# Patient Record
Sex: Female | Born: 1975
Health system: Southern US, Community
[De-identification: ages and names within clinical notes are randomized; demographics above are authoritative.]

## PROBLEM LIST (undated history)

## (undated) DIAGNOSIS — B977 Papillomavirus as the cause of diseases classified elsewhere: Secondary | ICD-10-CM

## (undated) DIAGNOSIS — D069 Carcinoma in situ of cervix, unspecified: Secondary | ICD-10-CM

## (undated) DIAGNOSIS — N390 Urinary tract infection, site not specified: Secondary | ICD-10-CM

## (undated) DIAGNOSIS — F419 Anxiety disorder, unspecified: Secondary | ICD-10-CM

## (undated) HISTORY — DX: Anxiety disorder, unspecified: F41.9

## (undated) HISTORY — DX: Papillomavirus as the cause of diseases classified elsewhere: B97.7

## (undated) HISTORY — DX: Carcinoma in situ of cervix, unspecified: D06.9

## (undated) HISTORY — DX: Urinary tract infection, site not specified: N39.0

---

## 2005-11-04 ENCOUNTER — Emergency Department: Payer: Self-pay | Admitting: Emergency Medicine

## 2008-07-01 ENCOUNTER — Ambulatory Visit: Payer: Self-pay | Admitting: Internal Medicine

## 2009-05-11 ENCOUNTER — Ambulatory Visit: Payer: Self-pay | Admitting: Internal Medicine

## 2010-06-12 LAB — HM PAP SMEAR: HM Pap smear: NORMAL

## 2010-11-14 ENCOUNTER — Ambulatory Visit: Payer: Self-pay

## 2011-02-28 ENCOUNTER — Ambulatory Visit: Payer: Self-pay | Admitting: Internal Medicine

## 2011-03-02 LAB — BETA STREP CULTURE(ARMC)

## 2011-06-12 ENCOUNTER — Encounter: Payer: Self-pay | Admitting: Internal Medicine

## 2011-06-12 ENCOUNTER — Ambulatory Visit (INDEPENDENT_AMBULATORY_CARE_PROVIDER_SITE_OTHER): Payer: PRIVATE HEALTH INSURANCE | Admitting: Internal Medicine

## 2011-06-12 VITALS — BP 114/72 | HR 72 | Temp 97.5°F | Resp 14 | Ht 66.25 in | Wt 163.0 lb

## 2011-06-12 DIAGNOSIS — IMO0001 Reserved for inherently not codable concepts without codable children: Secondary | ICD-10-CM

## 2011-06-12 DIAGNOSIS — D649 Anemia, unspecified: Secondary | ICD-10-CM

## 2011-06-12 DIAGNOSIS — E039 Hypothyroidism, unspecified: Secondary | ICD-10-CM

## 2011-06-12 DIAGNOSIS — Z309 Encounter for contraceptive management, unspecified: Secondary | ICD-10-CM

## 2011-06-12 DIAGNOSIS — J309 Allergic rhinitis, unspecified: Secondary | ICD-10-CM

## 2011-06-12 DIAGNOSIS — J302 Other seasonal allergic rhinitis: Secondary | ICD-10-CM

## 2011-06-12 LAB — COMPREHENSIVE METABOLIC PANEL
ALT: 13 U/L (ref 0–35)
AST: 18 U/L (ref 0–37)
Alkaline Phosphatase: 46 U/L (ref 39–117)
BUN: 9 mg/dL (ref 6–23)
Creatinine, Ser: 0.7 mg/dL (ref 0.4–1.2)
Total Bilirubin: 0.7 mg/dL (ref 0.3–1.2)

## 2011-06-12 LAB — CBC WITH DIFFERENTIAL/PLATELET
Basophils Absolute: 0 10*3/uL (ref 0.0–0.1)
Hemoglobin: 13.5 g/dL (ref 12.0–15.0)
Lymphocytes Relative: 27.9 % (ref 12.0–46.0)
Monocytes Relative: 4.2 % (ref 3.0–12.0)
Neutro Abs: 4.9 10*3/uL (ref 1.4–7.7)
RBC: 4.19 Mil/uL (ref 3.87–5.11)
RDW: 13 % (ref 11.5–14.6)

## 2011-06-12 LAB — T4, FREE: Free T4: 0.91 ng/dL (ref 0.60–1.60)

## 2011-06-12 MED ORDER — NORETHIN ACE-ETH ESTRAD-FE 1-20 MG-MCG(24) PO TABS: 1.0000 | ORAL_TABLET | Freq: Every day | ORAL | Status: DC

## 2011-06-12 NOTE — Assessment & Plan Note (Signed)
TSH in October 2012 was 5.9. Will repeat TSH and free T4 today. We discussed potential need for medication. Patient will followup in 5 weeks.

## 2011-06-12 NOTE — Assessment & Plan Note (Signed)
Patient reports iron deficiency anemia in the past. Likely secondary to menorrhagia. Will check CBC with labs today.

## 2011-06-12 NOTE — Assessment & Plan Note (Signed)
Minimal improvement with Allegra and Flonase. Will try changing nasal steroid to [facility]. Patient will followup in 5 weeks.

## 2011-06-12 NOTE — Assessment & Plan Note (Signed)
Discussed potential benefits and risks of oral contraceptives were discussed. We'll plan to continue with oral contraceptives now as we try to regulate thyroid function and then consider stopping medication in the future.

## 2011-06-12 NOTE — Progress Notes (Signed)
Subjective:    Patient ID: Colleen Rodriguez, female    DOB: 24-May-1975, 36 y.o.   MRN: [id number]  HPI 36 year old female presents to establish care. She brings with her some lab work from October 2012 which were performed at her work. These show elevated TSH of 5.9. She acknowledges symptoms of fatigue, cold intolerance. She has never been diagnosed with hypothyroidism before.  She also notes history of abnormal Pap smear and CIN-3. She reports that she is due for her yearly Pap smear. Her last Pap smear was in May of 2013 and she reports this was normal. She is currently asymptomatic.  She reports a history of seasonal allergies for which she is taking Flonase and Allegra. She has poor control of her symptoms on these medications. Symptoms include running nose and nasal congestion with sneezing. She has not had fever or chills.  She also reports that she has been taking oral contraceptive pills for approximately 5 years. She questions whether she should continue with these. She reports a history in the past of menorrhagia with iron deficiency anemia. Currently, menstrual cycles are light. She has no family history of blood clots. She is not a smoker. She has no history of hypertension.  Outpatient Encounter Prescriptions as of 06/12/2011  Medication Sig Dispense Refill  . albuterol (PROVENTIL HFA;VENTOLIN HFA) 108 (90 BASE) MCG/ACT inhaler Inhale 2 puffs into the lungs every 6 (six) hours as needed.      . fexofenadine (ALLEGRA) 180 MG tablet Take 180 mg by mouth daily.      . fluticasone (FLONASE) 50 MCG/ACT nasal spray Place 2 sprays into the nose daily.      Marland Kitchen Lysine 500 MG CAPS Take by mouth daily.      . Multiple Vitamin (MULTIVITAMIN) tablet Take 1 tablet by mouth daily.      . Norethindrone Acetate-Ethinyl Estrad-FE (LOESTRIN 24 FE) 1-20 MG-MCG(24) tablet Take 1 tablet by mouth daily.  3 Package  4  . DISCONTD: Norethin Ace-Eth Estrad-FE (LOESTRIN 24 FE PO) Take by mouth daily.         Review of Systems  Constitutional: Positive for fatigue. Negative for fever, chills, appetite change and unexpected weight change.  HENT: Positive for rhinorrhea, sneezing and postnasal drip. Negative for ear pain, congestion, sore throat, trouble swallowing, neck pain, voice change and sinus pressure.   Eyes: Negative for visual disturbance.  Respiratory: Negative for cough, shortness of breath, wheezing and stridor.   Cardiovascular: Negative for chest pain, palpitations and leg swelling.  Gastrointestinal: Negative for nausea, vomiting, abdominal pain, diarrhea, constipation, blood in stool, abdominal distention and anal bleeding.  Genitourinary: Negative for dysuria and flank pain.  Musculoskeletal: Negative for myalgias, arthralgias and gait problem.  Skin: Negative for color change and rash.  Neurological: Negative for dizziness and headaches.  Hematological: Negative for adenopathy. Does not bruise/bleed easily.  Psychiatric/Behavioral: Negative for suicidal ideas, sleep disturbance and dysphoric mood. The patient is not nervous/anxious.        Objective:   Physical Exam  Constitutional: She is oriented to person, place, and time. She appears well-developed and well-nourished. No distress.  HENT:  Head: Normocephalic and atraumatic.  Right Ear: External ear normal.  Left Ear: External ear normal.  Nose: Mucosal edema and rhinorrhea present.  Mouth/Throat: Oropharynx is clear and moist. No oropharyngeal exudate or posterior oropharyngeal erythema.  Eyes: Conjunctivae are normal. Pupils are equal, round, and reactive to light. Right eye exhibits no discharge. Left eye exhibits no discharge. No scleral icterus.  Neck: Normal range of motion. Neck supple. No tracheal deviation present. No thyromegaly present.  Cardiovascular: Normal rate, regular rhythm, normal heart sounds and intact distal pulses.  Exam reveals no gallop and no friction rub.   No murmur heard. Pulmonary/Chest:  Effort normal and breath sounds normal. No respiratory distress. She has no wheezes. She has no rales. She exhibits no tenderness.  Abdominal: Soft. Bowel sounds are normal. She exhibits no distension and no mass. There is no tenderness. There is no guarding.  Musculoskeletal: Normal range of motion. She exhibits no edema and no tenderness.  Lymphadenopathy:    She has no cervical adenopathy.  Neurological: She is alert and oriented to person, place, and time. No cranial nerve deficit. She exhibits normal muscle tone. Coordination normal.  Skin: Skin is warm and dry. No rash noted. She is not diaphoretic. No erythema. No pallor.  Psychiatric: She has a normal mood and affect. Her behavior is normal. Judgment and thought content normal.          Assessment & Plan:

## 2011-06-13 ENCOUNTER — Telehealth: Payer: Self-pay | Admitting: *Deleted

## 2011-06-13 NOTE — Telephone Encounter (Signed)
Message copied by Regis Bill on Tue Jun 13, 2011  2:28 PM ------      Message from: Ronna Polio A      Created: Mon Jun 12, 2011  6:37 PM       Labs show normal thyroid function. This was unexpected given that her screening labs at the hospital were abnormal.  It can happen sometimes, that the TSH is slightly elevated, then returns to normal.  We should probably recheck in 3 to 6 months, but I would not recommend starting any medication now.  We can add a thyroid antibody to see if any evidence of ongoing thyroid issue which might lead to problems down the road.

## 2011-06-13 NOTE — Telephone Encounter (Signed)
[facility] w/contact name & number for patient to call back for JAW advice on lab results/SLS

## 2011-06-19 NOTE — Telephone Encounter (Signed)
Left 2nd message for patient regarding thyroid test results: informed that it was concerning thyroid lab that Dr. Dan Humphreys would like to repeat in 3-6 months, and I have additional information for her at this time/SLS

## 2011-06-21 NOTE — Telephone Encounter (Signed)
Patient informed, she has OV in June and will discuss further with you/SLS

## 2011-07-20 ENCOUNTER — Encounter: Payer: Self-pay | Admitting: Internal Medicine

## 2011-07-20 ENCOUNTER — Ambulatory Visit (INDEPENDENT_AMBULATORY_CARE_PROVIDER_SITE_OTHER): Payer: PRIVATE HEALTH INSURANCE | Admitting: Internal Medicine

## 2011-07-20 ENCOUNTER — Other Ambulatory Visit (HOSPITAL_COMMUNITY)
Admission: RE | Admit: 2011-07-20 | Discharge: 2011-07-20 | Disposition: A | Discharge status: Home / Self Care | Payer: PRIVATE HEALTH INSURANCE | Source: Ambulatory Visit | Attending: Internal Medicine | Admitting: Internal Medicine

## 2011-07-20 VITALS — BP 102/60 | HR 85 | Temp 98.5°F | Ht 66.25 in | Wt 159.5 lb

## 2011-07-20 DIAGNOSIS — E079 Disorder of thyroid, unspecified: Secondary | ICD-10-CM

## 2011-07-20 DIAGNOSIS — Z01419 Encounter for gynecological examination (general) (routine) without abnormal findings: Secondary | ICD-10-CM | POA: Insufficient documentation

## 2011-07-20 DIAGNOSIS — Z0000 Encounter for general adult medical examination without abnormal findings: Secondary | ICD-10-CM | POA: Insufficient documentation

## 2011-07-20 DIAGNOSIS — Z124 Encounter for screening for malignant neoplasm of cervix: Secondary | ICD-10-CM

## 2011-07-20 DIAGNOSIS — Z1159 Encounter for screening for other viral diseases: Secondary | ICD-10-CM | POA: Insufficient documentation

## 2011-07-20 LAB — HM PAP SMEAR: HM Pap smear: NEGATIVE

## 2011-07-20 NOTE — Assessment & Plan Note (Signed)
General medical exam including breast exam and pelvic exam are normal today Pap is pending. Patient is up-to-date on health maintenance. Encourage continued efforts at healthy diet and regular physical activity. Patient will followup in 3 months.

## 2011-07-20 NOTE — Assessment & Plan Note (Signed)
Repeat thyroid labs were normal. Will plan to recheck TSH, free T4, and thyroid antibodies in 3 months.

## 2011-07-20 NOTE — Progress Notes (Signed)
Subjective:    Patient ID: Colleen Rodriguez, female    DOB: 1975-10-31, 36 y.o.   MRN: [id number]  HPI 36 year old female with history of abnormal thyroid function based on lab work presents for annual exam. She reports that she is generally feeling well. Repeat thyroid function test performed last month were normal. She denies any significant fatigue, or other symptoms. She follows a relatively healthy diet and gets regular physical activity. She is up-to-date on health maintenance with the exception of the Pap smear.  Outpatient Encounter Prescriptions as of 07/20/2011  Medication Sig Dispense Refill  . albuterol (PROVENTIL HFA;VENTOLIN HFA) 108 (90 BASE) MCG/ACT inhaler Inhale 2 puffs into the lungs every 6 (six) hours as needed.      . fexofenadine (ALLEGRA) 180 MG tablet Take 180 mg by mouth daily.      . fluticasone (FLONASE) 50 MCG/ACT nasal spray Place 2 sprays into the nose daily.      Marland Kitchen Lysine 500 MG CAPS Take by mouth daily.      . Multiple Vitamin (MULTIVITAMIN) tablet Take 1 tablet by mouth daily.      . Norethindrone Acetate-Ethinyl Estrad-FE (LOESTRIN 24 FE) 1-20 MG-MCG(24) tablet Take 1 tablet by mouth daily.  3 Package  4    Review of Systems  Constitutional: Negative for fever, chills, appetite change, fatigue and unexpected weight change.  HENT: Negative for ear pain, congestion, sore throat, trouble swallowing, neck pain, voice change and sinus pressure.   Eyes: Negative for visual disturbance.  Respiratory: Negative for cough, shortness of breath, wheezing and stridor.   Cardiovascular: Negative for chest pain, palpitations and leg swelling.  Gastrointestinal: Negative for nausea, vomiting, abdominal pain, diarrhea, constipation, blood in stool, abdominal distention and anal bleeding.  Genitourinary: Negative for dysuria and flank pain.  Musculoskeletal: Negative for myalgias, arthralgias and gait problem.  Skin: Negative for color change and rash.  Neurological: Negative  for dizziness and headaches.  Hematological: Negative for adenopathy. Does not bruise/bleed easily.  Psychiatric/Behavioral: Negative for suicidal ideas, disturbed wake/sleep cycle and dysphoric mood. The patient is not nervous/anxious.    BP 102/60  Pulse 85  Temp 98.5 F (36.9 C) (Oral)  Ht 5' 6.25" (1.683 m)  Wt 159 lb 8 oz (72.349 kg)  BMI 25.55 kg/m2  SpO2 98%  LMP 07/02/2011     Objective:   Physical Exam  Constitutional: She is oriented to person, place, and time. She appears well-developed and well-nourished. No distress.  HENT:  Head: Normocephalic and atraumatic.  Right Ear: External ear normal.  Left Ear: External ear normal.  Nose: Nose normal.  Mouth/Throat: Oropharynx is clear and moist. No oropharyngeal exudate.  Eyes: Conjunctivae are normal. Pupils are equal, round, and reactive to light. Right eye exhibits no discharge. Left eye exhibits no discharge. No scleral icterus.  Neck: Normal range of motion. Neck supple. No tracheal deviation present. No thyromegaly present.  Cardiovascular: Normal rate, regular rhythm, normal heart sounds and intact distal pulses.  Exam reveals no gallop and no friction rub.   No murmur heard. Pulmonary/Chest: Effort normal and breath sounds normal. No respiratory distress. She has no wheezes. She has no rales. She exhibits no tenderness.  Abdominal: Soft. Bowel sounds are normal. She exhibits no distension and no mass. There is no tenderness. There is no rebound and no guarding.  Genitourinary: Rectum normal, vagina normal and uterus normal. No breast swelling, tenderness, discharge or bleeding. Pelvic exam was performed with patient supine. There is no rash, tenderness or lesion  on the right labia. There is no rash, tenderness or lesion on the left labia. Uterus is not enlarged and not tender. Cervix exhibits no motion tenderness, no discharge and no friability. Right adnexum displays no mass, no tenderness and no fullness. Left adnexum  displays no mass, no tenderness and no fullness. No erythema or tenderness around the vagina. No vaginal discharge found.  Musculoskeletal: Normal range of motion. She exhibits no edema and no tenderness.  Lymphadenopathy:    She has no cervical adenopathy.  Neurological: She is alert and oriented to person, place, and time. No cranial nerve deficit. She exhibits normal muscle tone. Coordination normal.  Skin: Skin is warm and dry. No rash noted. She is not diaphoretic. No erythema. No pallor.  Psychiatric: She has a normal mood and affect. Her behavior is normal. Judgment and thought content normal.          Assessment & Plan:

## 2011-10-16 ENCOUNTER — Other Ambulatory Visit: Payer: PRIVATE HEALTH INSURANCE

## 2011-10-23 ENCOUNTER — Ambulatory Visit: Payer: PRIVATE HEALTH INSURANCE | Admitting: Internal Medicine

## 2011-10-23 DIAGNOSIS — Z0289 Encounter for other administrative examinations: Secondary | ICD-10-CM

## 2012-03-16 ENCOUNTER — Other Ambulatory Visit: Payer: Self-pay

## 2012-07-23 ENCOUNTER — Other Ambulatory Visit: Payer: Self-pay | Admitting: *Deleted

## 2012-07-23 DIAGNOSIS — IMO0001 Reserved for inherently not codable concepts without codable children: Secondary | ICD-10-CM

## 2012-07-23 NOTE — Telephone Encounter (Signed)
Pt left voicemail requesting Rx refill, no recent or future appt and pt no-showed last appt scheduled, please advised

## 2012-07-23 NOTE — Telephone Encounter (Signed)
Needs follow up visit (gen med exam) as over 1 year since last seen.

## 2012-10-22 ENCOUNTER — Other Ambulatory Visit: Payer: Self-pay | Admitting: *Deleted

## 2012-10-22 DIAGNOSIS — IMO0001 Reserved for inherently not codable concepts without codable children: Secondary | ICD-10-CM

## 2012-10-22 NOTE — Telephone Encounter (Signed)
Pt needs office visit

## 2012-10-22 NOTE — Telephone Encounter (Signed)
Refill Request  Minastrin 24 FE chewable   #84  Take one tablet by mouth daily

## 2012-11-06 ENCOUNTER — Other Ambulatory Visit: Payer: Self-pay | Admitting: *Deleted

## 2012-11-06 ENCOUNTER — Telehealth: Payer: Self-pay | Admitting: Internal Medicine

## 2012-11-06 DIAGNOSIS — IMO0001 Reserved for inherently not codable concepts without codable children: Secondary | ICD-10-CM

## 2012-11-06 NOTE — Telephone Encounter (Signed)
Refill Request  Minastrin 24 FE chewable  #84  Take one tablet by mouth daily

## 2012-11-06 NOTE — Telephone Encounter (Signed)
Pt calling for status of Flonase and Loestrin refills.  States her pharmacy faxed requests last week.  Pt states she called on Monday regarding this.  Pt states [facility] still does not have response and is resending the request today.  Pt asking for a call when this is complete.

## 2012-11-07 MED ORDER — NORETHIN ACE-ETH ESTRAD-FE 1-20 MG-MCG(24) PO TABS: 1.0000 | ORAL_TABLET | Freq: Every day | ORAL | Status: DC

## 2012-11-07 MED ORDER — FLUTICASONE PROPIONATE 50 MCG/ACT NA SUSP: 2.0000 | Freq: Every day | NASAL | Status: DC

## 2012-11-07 NOTE — Telephone Encounter (Signed)
Spoke with patient explained to her that her refills was refused because it has been more than a year since she was seen. Patient understood and appointment was made for 11/7, also aware that I would call her in a 30 day supply to the pharmacy.

## 2012-11-26 ENCOUNTER — Encounter: Payer: Self-pay | Admitting: Orthopedic Surgery

## 2012-11-30 ENCOUNTER — Encounter: Payer: Self-pay | Admitting: Orthopedic Surgery

## 2012-12-05 ENCOUNTER — Other Ambulatory Visit: Payer: Self-pay

## 2012-12-06 ENCOUNTER — Ambulatory Visit (INDEPENDENT_AMBULATORY_CARE_PROVIDER_SITE_OTHER): Payer: 59 | Admitting: Internal Medicine

## 2012-12-06 ENCOUNTER — Encounter: Payer: Self-pay | Admitting: Internal Medicine

## 2012-12-06 VITALS — BP 100/62 | HR 72 | Temp 98.2°F | Ht 66.5 in | Wt 150.0 lb

## 2012-12-06 DIAGNOSIS — IMO0001 Reserved for inherently not codable concepts without codable children: Secondary | ICD-10-CM

## 2012-12-06 DIAGNOSIS — Z0000 Encounter for general adult medical examination without abnormal findings: Secondary | ICD-10-CM

## 2012-12-06 DIAGNOSIS — Z309 Encounter for contraceptive management, unspecified: Secondary | ICD-10-CM

## 2012-12-06 DIAGNOSIS — M25519 Pain in unspecified shoulder: Secondary | ICD-10-CM

## 2012-12-06 DIAGNOSIS — M25512 Pain in left shoulder: Secondary | ICD-10-CM

## 2012-12-06 LAB — COMPREHENSIVE METABOLIC PANEL
ALT: 14 U/L (ref 0–35)
AST: 15 U/L (ref 0–37)
Albumin: 4 g/dL (ref 3.5–5.2)
Alkaline Phosphatase: 46 U/L (ref 39–117)
Glucose, Bld: 85 mg/dL (ref 70–99)
Potassium: 4.6 mEq/L (ref 3.5–5.1)
Sodium: 138 mEq/L (ref 135–145)
Total Bilirubin: 0.6 mg/dL (ref 0.3–1.2)
Total Protein: 7.3 g/dL (ref 6.0–8.3)

## 2012-12-06 LAB — LIPID PANEL
LDL Cholesterol: 101 mg/dL — ABNORMAL HIGH (ref 0–99)
Total CHOL/HDL Ratio: 2

## 2012-12-06 LAB — CBC
Platelets: 246 10*3/uL (ref 150.0–400.0)
RBC: 4.08 Mil/uL (ref 3.87–5.11)
WBC: 8.1 10*3/uL (ref 4.5–10.5)

## 2012-12-06 MED ORDER — HYDROCODONE-ACETAMINOPHEN 5-325 MG PO TABS: 1.0000 | ORAL_TABLET | Freq: Three times a day (TID) | ORAL | Status: DC | PRN

## 2012-12-06 MED ORDER — NORETHIN ACE-ETH ESTRAD-FE 1-20 MG-MCG(24) PO TABS: 1.0000 | ORAL_TABLET | Freq: Every day | ORAL | Status: DC

## 2012-12-06 NOTE — Progress Notes (Signed)
Subjective:    Patient ID: Colleen Rodriguez, female    DOB: [birth year], 37 y.o.   MRN: [id number]  HPI 37YO female presents for annual exam. Generally feeling well. Only recent concern has been with left shoulder pain. Described as aching pain, severe with movement of her left arm over last several months. No known injury to her shoulder. Being seen by Dr. Kingsley Plan in ortho. PT in progress. No improvement yet. Taking Ibuprofen 800mg  tid with minimal improvement. Notes occasional abdominal pain, described as burning after taking ibuprofen. Trying to take with food. Follows a healthy diet. Exercises regularly. Flu vaccine UTD.  Outpatient Encounter Prescriptions as of 12/06/2012  Medication Sig  . albuterol (PROVENTIL HFA;VENTOLIN HFA) 108 (90 BASE) MCG/ACT inhaler Inhale 2 puffs into the lungs every 6 (six) hours as needed.  . fexofenadine (ALLEGRA) 180 MG tablet Take 180 mg by mouth daily.  . fluticasone (FLONASE) 50 MCG/ACT nasal spray Place 2 sprays into the nose daily.  Marland Kitchen ibuprofen (ADVIL,MOTRIN) 800 MG tablet Take 800 mg by mouth every 8 (eight) hours as needed for mild pain (As needed for shoulder pain).  . Lysine 500 MG CAPS Take by mouth daily.  . Multiple Vitamin (MULTIVITAMIN) tablet Take 1 tablet by mouth daily.  . Norethindrone Acetate-Ethinyl Estrad-FE (LOESTRIN 24 FE) 1-20 MG-MCG(24) tablet Take 1 tablet by mouth daily.   BP 100/62  Pulse 72  Temp(Src) 98.2 F (36.8 C) (Oral)  Ht 5' 6.5" (1.689 m)  Wt 150 lb (68.04 kg)  BMI 23.85 kg/m2  SpO2 97%  LMP 11/30/2012  Review of Systems  Constitutional: Negative for fever, chills, appetite change, fatigue and unexpected weight change.  HENT: Negative for congestion, ear pain, sinus pressure, sore throat, trouble swallowing and voice change.   Eyes: Negative for visual disturbance.  Respiratory: Negative for cough, shortness of breath, wheezing and stridor.   Cardiovascular: Negative for chest pain, palpitations and leg swelling.   Gastrointestinal: Negative for nausea, vomiting, abdominal pain, diarrhea, constipation, blood in stool, abdominal distention and anal bleeding.  Genitourinary: Negative for dysuria and flank pain.  Musculoskeletal: Positive for arthralgias (left shoulder). Negative for gait problem, myalgias and neck pain.  Skin: Negative for color change and rash.  Neurological: Negative for dizziness and headaches.  Hematological: Negative for adenopathy. Does not bruise/bleed easily.  Psychiatric/Behavioral: Negative for suicidal ideas, sleep disturbance and dysphoric mood. The patient is not nervous/anxious.        Objective:   Physical Exam  Constitutional: She is oriented to person, place, and time. She appears well-developed and well-nourished. No distress.  HENT:  Head: Normocephalic and atraumatic.  Right Ear: External ear normal.  Left Ear: External ear normal.  Nose: Nose normal.  Mouth/Throat: Oropharynx is clear and moist. No oropharyngeal exudate.  Eyes: Conjunctivae are normal. Pupils are equal, round, and reactive to light. Right eye exhibits no discharge. Left eye exhibits no discharge. No scleral icterus.  Neck: Normal range of motion. Neck supple. No tracheal deviation present. No thyromegaly present.  Cardiovascular: Normal rate, regular rhythm, normal heart sounds and intact distal pulses.  Exam reveals no gallop and no friction rub.   No murmur heard. Pulmonary/Chest: Effort normal and breath sounds normal. No accessory muscle usage. Not tachypneic. No respiratory distress. She has no decreased breath sounds. She has no wheezes. She has no rales. She exhibits no tenderness. Right breast exhibits no inverted nipple, no mass, no nipple discharge, no skin change and no tenderness. Left breast exhibits no inverted nipple, no mass,  no nipple discharge, no skin change and no tenderness. Breasts are symmetrical.  Abdominal: Soft. Bowel sounds are normal. She exhibits no distension and no  mass. There is no tenderness. There is no rebound and no guarding.  Musculoskeletal: She exhibits no edema.       Left shoulder: She exhibits decreased range of motion, tenderness and pain (severe with any movement left arm). She exhibits no swelling.  Lymphadenopathy:    She has no cervical adenopathy.  Neurological: She is alert and oriented to person, place, and time. No cranial nerve deficit. She exhibits normal muscle tone. Coordination normal.  Skin: Skin is warm and dry. No rash noted. She is not diaphoretic. No erythema. No pallor.  Psychiatric: She has a normal mood and affect. Her behavior is normal. Judgment and thought content normal.          Assessment & Plan:

## 2012-12-06 NOTE — Assessment & Plan Note (Signed)
General medical exam including breast exam normal today except as noted. PAP deferred as normal 2012, plan repeat 2015. Encouraged continued healthy diet and regular physical activity. Immunizations are UTD. Encouraged healthy diet and regular exercise.

## 2012-12-06 NOTE — Progress Notes (Signed)
Pre-visit discussion using our clinic review tool. No additional management support is needed unless otherwise documented below in the visit note.  

## 2012-12-06 NOTE — Assessment & Plan Note (Signed)
Left shoulder pain of unclear etiology. No improvement with PT, ibuprofen. Severe pain with abduction of her left arm today. Will add hydrocodone prn. Encouraged her to follow up with her orthopedic surgeon.

## 2012-12-07 LAB — VITAMIN D 25 HYDROXY (VIT D DEFICIENCY, FRACTURES): Vit D, 25-Hydroxy: 44 ng/mL (ref 30–89)

## 2012-12-30 ENCOUNTER — Encounter: Payer: Self-pay | Admitting: Orthopedic Surgery

## 2012-12-31 ENCOUNTER — Encounter: Payer: Self-pay | Admitting: Internal Medicine

## 2012-12-31 ENCOUNTER — Ambulatory Visit (INDEPENDENT_AMBULATORY_CARE_PROVIDER_SITE_OTHER): Payer: 59 | Admitting: Internal Medicine

## 2012-12-31 VITALS — BP 90/50 | HR 88 | Temp 98.2°F | Wt 156.0 lb

## 2012-12-31 DIAGNOSIS — J069 Acute upper respiratory infection, unspecified: Secondary | ICD-10-CM

## 2012-12-31 DIAGNOSIS — J329 Chronic sinusitis, unspecified: Secondary | ICD-10-CM

## 2012-12-31 MED ORDER — HYDROCOD POLST-CHLORPHEN POLST 10-8 MG/5ML PO LQCR: 5.0000 mL | Freq: Two times a day (BID) | ORAL | Status: DC | PRN

## 2012-12-31 MED ORDER — AZITHROMYCIN 250 MG PO TABS: ORAL_TABLET | ORAL | Status: DC

## 2012-12-31 NOTE — Progress Notes (Signed)
Subjective:    Patient ID: Colleen Rodriguez, female    DOB: 02-17-1975, 37 y.o.   MRN: [id number]  HPI 37 year old female presents for acute visit complaining of three-day history of clear nasal drainage and nonproductive cough. Symptoms began suddenly over the weekend. Her son was sick with similar symptoms about 2 weeks ago. She had subjective fever and chills over the weekend with occasional muscle aches. She then developed clear nasal congestion with postnasal drip and cough. She denies shortness of breath or chest pain. She has been taking Advil cold and sinus with no improvement.  Outpatient Prescriptions Prior to Visit  Medication Sig Dispense Refill  . albuterol (PROVENTIL HFA;VENTOLIN HFA) 108 (90 BASE) MCG/ACT inhaler Inhale 2 puffs into the lungs every 6 (six) hours as needed.      . fexofenadine (ALLEGRA) 180 MG tablet Take 180 mg by mouth daily.      . fluticasone (FLONASE) 50 MCG/ACT nasal spray Place 2 sprays into the nose daily.  16 g  0  . ibuprofen (ADVIL,MOTRIN) 800 MG tablet Take 800 mg by mouth every 8 (eight) hours as needed for mild pain (As needed for shoulder pain).      . Lysine 500 MG CAPS Take by mouth daily.      . Multiple Vitamin (MULTIVITAMIN) tablet Take 1 tablet by mouth daily.      . Norethindrone Acetate-Ethinyl Estrad-FE (LOESTRIN 24 FE) 1-20 MG-MCG(24) tablet Take 1 tablet by mouth daily.  3 Package  4  . HYDROcodone-acetaminophen (NORCO/VICODIN) 5-325 MG per tablet Take 1 tablet by mouth 3 (three) times daily as needed for moderate pain.  90 tablet  0   No facility-administered medications prior to visit.   BP 90/50  Pulse 88  Temp(Src) 98.2 F (36.8 C) (Oral)  Wt 156 lb (70.761 kg)  SpO2 97%  LMP 11/30/2012  Review of Systems  Constitutional: Positive for fever (subjective). Negative for chills and unexpected weight change.  HENT: Positive for congestion, postnasal drip and rhinorrhea. Negative for ear discharge, ear pain, facial swelling, hearing  loss, mouth sores, nosebleeds, sinus pressure, sneezing, sore throat, tinnitus, trouble swallowing and voice change.   Eyes: Negative for pain, discharge, redness and visual disturbance.  Respiratory: Positive for cough. Negative for chest tightness, shortness of breath, wheezing and stridor.   Cardiovascular: Negative for chest pain, palpitations and leg swelling.  Musculoskeletal: Negative for arthralgias, myalgias, neck pain and neck stiffness.  Skin: Negative for color change and rash.  Neurological: Negative for dizziness, weakness, light-headedness and headaches.  Hematological: Negative for adenopathy.       Objective:   Physical Exam  Constitutional: She is oriented to person, place, and time. She appears well-developed and well-nourished. No distress.  HENT:  Head: Normocephalic and atraumatic.  Right Ear: External ear normal.  Left Ear: External ear normal.  Nose: Rhinorrhea present.  Mouth/Throat: Oropharynx is clear and moist. No oropharyngeal exudate.  Eyes: Conjunctivae are normal. Pupils are equal, round, and reactive to light. Right eye exhibits no discharge. Left eye exhibits no discharge. No scleral icterus.  Neck: Normal range of motion. Neck supple. No tracheal deviation present. No thyromegaly present.  Cardiovascular: Normal rate, regular rhythm, normal heart sounds and intact distal pulses.  Exam reveals no gallop and no friction rub.   No murmur heard. Pulmonary/Chest: Effort normal and breath sounds normal. No accessory muscle usage. Not tachypneic. No respiratory distress. She has no decreased breath sounds. She has no wheezes. She has no rhonchi. She has  no rales. She exhibits no tenderness.  Musculoskeletal: Normal range of motion. She exhibits no edema and no tenderness.  Lymphadenopathy:    She has no cervical adenopathy.  Neurological: She is alert and oriented to person, place, and time. No cranial nerve deficit. She exhibits normal muscle tone.  Coordination normal.  Skin: Skin is warm and dry. No rash noted. She is not diaphoretic. No erythema. No pallor.  Psychiatric: She has a normal mood and affect. Her behavior is normal. Judgment and thought content normal.          Assessment & Plan:

## 2012-12-31 NOTE — Assessment & Plan Note (Signed)
Symptoms are most consistent with viral URI with cough. Encouraged rest, adequate fluid intake, use of sudafed for congestion. Prn tylenol or advil for pain or fever. If symptoms are persistent with increasing sinus pressure, recurrent fever, productive cough, then pt will start antibiotics with Azithromycin. Rx to take given today. She will email with update on Thursday or Friday this week.

## 2012-12-31 NOTE — Progress Notes (Signed)
Pre-visit discussion using our clinic review tool. No additional management support is needed unless otherwise documented below in the visit note.  

## 2013-01-01 ENCOUNTER — Encounter: Payer: Self-pay | Admitting: *Deleted

## 2013-01-30 ENCOUNTER — Encounter: Payer: Self-pay | Admitting: Orthopedic Surgery

## 2013-03-09 ENCOUNTER — Encounter: Payer: Self-pay | Admitting: Internal Medicine

## 2013-03-10 ENCOUNTER — Ambulatory Visit (INDEPENDENT_AMBULATORY_CARE_PROVIDER_SITE_OTHER): Payer: 59 | Admitting: Adult Health

## 2013-03-10 ENCOUNTER — Encounter: Payer: Self-pay | Admitting: Adult Health

## 2013-03-10 VITALS — BP 102/60 | HR 81 | Temp 97.9°F | Resp 12 | Wt 155.0 lb

## 2013-03-10 DIAGNOSIS — R319 Hematuria, unspecified: Secondary | ICD-10-CM

## 2013-03-10 DIAGNOSIS — R300 Dysuria: Secondary | ICD-10-CM

## 2013-03-10 LAB — POCT URINALYSIS DIPSTICK
Bilirubin, UA: NEGATIVE
GLUCOSE UA: NEGATIVE
Ketones, UA: NEGATIVE
LEUKOCYTES UA: NEGATIVE
NITRITE UA: NEGATIVE
Protein, UA: NEGATIVE
Spec Grav, UA: >=1.03
UROBILINOGEN UA: 0.2
pH, UA: 6.5

## 2013-03-10 MED ORDER — CIPROFLOXACIN HCL 250 MG PO TABS: 250.0000 mg | ORAL_TABLET | Freq: Two times a day (BID) | ORAL | Status: DC

## 2013-03-10 NOTE — Progress Notes (Signed)
Patient ID: Colleen BeringKathyrn Hicks, female   DOB: [birth year], 38 y.o.   MRN: [id number]    Subjective:    Patient ID: Colleen Rodriguez, female    DOB: [birth year], 38 y.o.   MRN: [id number]  HPI  Pt is a 38 y/o female who presents with dysuria, hematuria, pelvic pressure. Symptoms started yesterday. She has not taken any OTC products.   Past Medical History  Diagnosis Date  . Urinary tract infection   . HPV (human papilloma virus) infection   . CIN 3 - cervical intraepithelial neoplasia grade 3   . Left shoulder strain     [facility]    Current Outpatient Prescriptions on File Prior to Visit  Medication Sig Dispense Refill  . albuterol (PROVENTIL HFA;VENTOLIN HFA) 108 (90 BASE) MCG/ACT inhaler Inhale 2 puffs into the lungs every 6 (six) hours as needed.      . fexofenadine (ALLEGRA) 180 MG tablet Take 180 mg by mouth daily.      . fluticasone (FLONASE) 50 MCG/ACT nasal spray Place 2 sprays into the nose daily.  16 g  0  . ibuprofen (ADVIL,MOTRIN) 800 MG tablet Take 800 mg by mouth every 8 (eight) hours as needed for mild pain (As needed for shoulder pain).      . Lysine 500 MG CAPS Take by mouth daily.      . Multiple Vitamin (MULTIVITAMIN) tablet Take 1 tablet by mouth daily.      . Norethindrone Acetate-Ethinyl Estrad-FE (LOESTRIN 24 FE) 1-20 MG-MCG(24) tablet Take 1 tablet by mouth daily.  3 Package  4   No current facility-administered medications on file prior to visit.     Review of Systems  Constitutional: Negative for fever and chills.  Genitourinary: Positive for dysuria, urgency, frequency and hematuria. Negative for flank pain.       Pelvic pressure  All other systems reviewed and are negative.       Objective:  BP 102/60  Pulse 81  Temp(Src) 97.9 F (36.6 C) (Oral)  Resp 12  Wt 155 lb (70.308 kg)  SpO2 97%   Physical Exam  Constitutional: She is oriented to person, place, and time. No distress.  Cardiovascular: Normal rate and regular rhythm.     Pulmonary/Chest: Effort normal. No respiratory distress.  Genitourinary:  Mild suprapubic discomfort  Musculoskeletal: Normal range of motion.  Neurological: She is alert and oriented to person, place, and time.  Skin: Skin is warm and dry.  Psychiatric: She has a normal mood and affect. Her behavior is normal. Judgment and thought content normal.       Assessment & Plan:   1. Dysuria UA showing blood. Send for culture. Start Cipro 250 mg bid x 3 days - POCT Urinalysis Dipstick - Urine Culture  2. Hematuria Pt reports earlier hematuria. None seen in clinic. Will send for microscopic eval. - Urinalysis, Routine w reflex microscopic

## 2013-03-11 LAB — URINE CULTURE
COLONY COUNT: NO GROWTH
ORGANISM ID, BACTERIA: NO GROWTH

## 2013-03-13 ENCOUNTER — Encounter: Payer: Self-pay | Admitting: Adult Health

## 2013-04-10 ENCOUNTER — Encounter: Payer: Self-pay | Admitting: *Deleted

## 2013-12-09 ENCOUNTER — Encounter: Payer: 59 | Admitting: Internal Medicine

## 2014-02-17 ENCOUNTER — Other Ambulatory Visit: Payer: Self-pay | Admitting: Internal Medicine

## 2014-08-20 ENCOUNTER — Other Ambulatory Visit: Payer: Self-pay | Admitting: Internal Medicine

## 2014-09-09 ENCOUNTER — Other Ambulatory Visit (HOSPITAL_COMMUNITY)
Admission: RE | Admit: 2014-09-09 | Discharge: 2014-09-09 | Disposition: A | Discharge status: Home / Self Care | Payer: 59 | Source: Ambulatory Visit | Attending: Internal Medicine | Admitting: Internal Medicine

## 2014-09-09 ENCOUNTER — Ambulatory Visit (INDEPENDENT_AMBULATORY_CARE_PROVIDER_SITE_OTHER): Payer: 59 | Admitting: Internal Medicine

## 2014-09-09 ENCOUNTER — Encounter: Payer: Self-pay | Admitting: Internal Medicine

## 2014-09-09 VITALS — BP 118/58 | HR 86 | Temp 98.2°F | Ht 66.5 in | Wt 168.0 lb

## 2014-09-09 DIAGNOSIS — Z0000 Encounter for general adult medical examination without abnormal findings: Secondary | ICD-10-CM

## 2014-09-09 DIAGNOSIS — Z309 Encounter for contraceptive management, unspecified: Secondary | ICD-10-CM | POA: Diagnosis not present

## 2014-09-09 DIAGNOSIS — Z1151 Encounter for screening for human papillomavirus (HPV): Secondary | ICD-10-CM | POA: Insufficient documentation

## 2014-09-09 DIAGNOSIS — Z01419 Encounter for gynecological examination (general) (routine) without abnormal findings: Secondary | ICD-10-CM | POA: Insufficient documentation

## 2014-09-09 LAB — HEMOGLOBIN A1C: Hgb A1c MFr Bld: 5.1 % (ref 4.6–6.5)

## 2014-09-09 LAB — COMPREHENSIVE METABOLIC PANEL
ALBUMIN: 4.2 g/dL (ref 3.5–5.2)
ALT: 22 U/L (ref 0–35)
AST: 18 U/L (ref 0–37)
Alkaline Phosphatase: 59 U/L (ref 39–117)
BUN: 13 mg/dL (ref 6–23)
CO2: 29 meq/L (ref 19–32)
CREATININE: 0.64 mg/dL (ref 0.40–1.20)
Calcium: 9.5 mg/dL (ref 8.4–10.5)
Chloride: 100 mEq/L (ref 96–112)
GFR: 110.11 mL/min (ref >60.00)
Glucose, Bld: 101 mg/dL — ABNORMAL HIGH (ref 70–99)
Potassium: 3.8 mEq/L (ref 3.5–5.1)
Sodium: 138 mEq/L (ref 135–145)
Total Bilirubin: 0.9 mg/dL (ref 0.2–1.2)
Total Protein: 6.9 g/dL (ref 6.0–8.3)

## 2014-09-09 LAB — VITAMIN D 25 HYDROXY (VIT D DEFICIENCY, FRACTURES): VITD: 25.04 ng/mL — ABNORMAL LOW (ref 30.00–100.00)

## 2014-09-09 LAB — LIPID PANEL
Cholesterol: 208 mg/dL — ABNORMAL HIGH (ref 0–200)
HDL: 92.9 mg/dL (ref >39.00)
LDL Cholesterol: 105 mg/dL — ABNORMAL HIGH (ref 0–99)
NONHDL: 115.15
Total CHOL/HDL Ratio: 2
Triglycerides: 52 mg/dL (ref 0.0–149.0)
VLDL: 10.4 mg/dL (ref 0.0–40.0)

## 2014-09-09 LAB — CBC WITH DIFFERENTIAL/PLATELET
Basophils Absolute: 0 10*3/uL (ref 0.0–0.1)
Basophils Relative: 0.6 % (ref 0.0–3.0)
EOS PCT: 1.8 % (ref 0.0–5.0)
Eosinophils Absolute: 0.1 10*3/uL (ref 0.0–0.7)
HCT: 40 % (ref 36.0–46.0)
Hemoglobin: 13.6 g/dL (ref 12.0–15.0)
Lymphocytes Relative: 25 % (ref 12.0–46.0)
Lymphs Abs: 2 10*3/uL (ref 0.7–4.0)
MCHC: 34 g/dL (ref 30.0–36.0)
MCV: 93.1 fl (ref 78.0–100.0)
MONOS PCT: 5.9 % (ref 3.0–12.0)
Monocytes Absolute: 0.5 10*3/uL (ref 0.1–1.0)
NEUTROS PCT: 66.7 % (ref 43.0–77.0)
Neutro Abs: 5.2 10*3/uL (ref 1.4–7.7)
Platelets: 286 10*3/uL (ref 150.0–400.0)
RBC: 4.3 Mil/uL (ref 3.87–5.11)
RDW: 13.5 % (ref 11.5–15.5)
WBC: 7.9 10*3/uL (ref 4.0–10.5)

## 2014-09-09 LAB — TSH: TSH: 3.09 u[IU]/mL (ref 0.35–4.50)

## 2014-09-09 MED ORDER — NORETHIN ACE-ETH ESTRAD-FE 1-20 MG-MCG(24) PO TABS: 1.0000 | ORAL_TABLET | Freq: Every day | ORAL | Status: DC

## 2014-09-09 NOTE — Assessment & Plan Note (Signed)
General medical exam normal today including breast and pelvic exam. PAP pending. Labs as ordered. Encouraged healthy diet and exercise.

## 2014-09-09 NOTE — Patient Instructions (Signed)

## 2014-09-09 NOTE — Addendum Note (Signed)
Addended by: Montine Circle D on: 09/09/2014 02:28 PM   Modules accepted: Orders

## 2014-09-09 NOTE — Progress Notes (Signed)
Subjective:    Patient ID: Colleen Rodriguez, female    DOB: 02-16-1975, 39 y.o.   MRN: [id number]  HPI  39YO female presents for annual exam.  Feeling well. No concerns today. Following a healthy diet and staying active. Continues to work at [employer]. Recently bought a home with her boyfriend.   Past Medical History  Diagnosis Date  . Urinary tract infection   . HPV (human papilloma virus) infection   . CIN 3 - cervical intraepithelial neoplasia grade 3   . Left shoulder strain     Dr. Elroy Channel at [facility]   Family History  Problem Relation Age of Onset  . Stroke Paternal Grandfather    Social History   Social History  . Marital Status: Single    Spouse Name: N/A  . Number of Children: N/A  . Years of Education: N/A   Social History Main Topics  . Smoking status: Former Smoker    Quit date: 01/30/2006  . Smokeless tobacco: Not on file  . Alcohol Use: Yes     Comment: occasional  . Drug Use: Not on file  . Sexual Activity: Not on file   Other Topics Concern  . Not on file   Social History Narrative   Lives in [place] with children 14YO and 12YO.  No pets.      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 Vaginal delivery      2    Review of Systems  Constitutional: Negative for fever, chills, appetite change, fatigue and unexpected weight change.  Eyes: Negative for visual disturbance.  Respiratory: Negative for shortness of breath.   Cardiovascular: Negative for chest pain and leg swelling.  Gastrointestinal: Negative for nausea, vomiting, abdominal pain, diarrhea and constipation.  Genitourinary: Negative for menstrual problem.  Musculoskeletal: Negative for myalgias and arthralgias.  Skin: Negative for color change and rash.  Neurological: Negative for dizziness, weakness, light-headedness and headaches.  Hematological: Negative for adenopathy. Does  not bruise/bleed easily.  Psychiatric/Behavioral: Negative for suicidal ideas, sleep disturbance and dysphoric mood. The patient is not nervous/anxious.        Objective:    BP 118/58 mmHg  Pulse 86  Temp(Src) 98.2 F (36.8 C) (Oral)  Ht 5' 6.5" (1.689 m)  Wt 168 lb (76.204 kg)  BMI 26.71 kg/m2  SpO2 98%  LMP 08/10/2014 (Approximate) Physical Exam  Constitutional: She is oriented to person, place, and time. She appears well-developed and well-nourished. No distress.  HENT:  Head: Normocephalic and atraumatic.  Right Ear: External ear normal.  Left Ear: External ear normal.  Nose: Nose normal.  Mouth/Throat: Oropharynx is clear and moist. No oropharyngeal exudate.  Eyes: Conjunctivae are normal. Pupils are equal, round, and reactive to light. Right eye exhibits no discharge. Left eye exhibits no discharge. No scleral icterus.  Neck: Normal range of motion. Neck supple. No tracheal deviation present. No thyromegaly present.  Cardiovascular: Normal rate, regular rhythm, normal heart sounds and intact distal pulses.  Exam reveals no gallop and no friction rub.   No murmur heard. Pulmonary/Chest: Effort normal and breath sounds normal. No respiratory distress. She has no wheezes. She has no rales. She exhibits no tenderness.  Abdominal: Soft. Bowel sounds are normal. She exhibits no distension and no mass. There is no tenderness. There is no rebound and no guarding.  Genitourinary: Rectum  normal, vagina normal and uterus normal. No breast swelling, tenderness, discharge or bleeding. Pelvic exam was performed with patient supine. There is no rash, tenderness or lesion on the right labia. There is no rash, tenderness or lesion on the left labia. Uterus is not enlarged and not tender. Cervix exhibits no motion tenderness, no discharge and no friability. Right adnexum displays no mass, no tenderness and no fullness. Left adnexum displays no mass, no tenderness and no fullness. No erythema or  tenderness in the vagina. No vaginal discharge found.  Musculoskeletal: Normal range of motion. She exhibits no edema or tenderness.  Lymphadenopathy:    She has no cervical adenopathy.  Neurological: She is alert and oriented to person, place, and time. No cranial nerve deficit. She exhibits normal muscle tone. Coordination normal.  Skin: Skin is warm and dry. No rash noted. She is not diaphoretic. No erythema. No pallor.  Psychiatric: She has a normal mood and affect. Her behavior is normal. Judgment and thought content normal.          Assessment & Plan:   Problem List Items Addressed This Visit      Unprioritized   Contraception   Relevant Medications   Norethindrone Acetate-Ethinyl Estrad-FE (LOESTRIN 24 FE) 1-20 MG-MCG(24) tablet   Routine general medical examination at a health care facility - Primary   Relevant Orders   TSH   CBC with Differential/Platelet   Comprehensive metabolic panel   Lipid panel   Hemoglobin A1c   Vit D  25 hydroxy (rtn osteoporosis monitoring)       Return in about 1 year (around 09/09/2015) for Physical.

## 2014-09-11 LAB — CYTOLOGY - PAP

## 2016-02-01 ENCOUNTER — Telehealth: Payer: 59 | Admitting: Family

## 2016-02-01 DIAGNOSIS — N760 Acute vaginitis: Secondary | ICD-10-CM | POA: Diagnosis not present

## 2016-02-01 DIAGNOSIS — B9689 Other specified bacterial agents as the cause of diseases classified elsewhere: Secondary | ICD-10-CM | POA: Diagnosis not present

## 2016-02-01 MED ORDER — METRONIDAZOLE 500 MG PO TABS: 500.0000 mg | ORAL_TABLET | Freq: Two times a day (BID) | ORAL | 0 refills | Status: DC

## 2016-02-01 NOTE — Progress Notes (Signed)

## 2016-02-21 ENCOUNTER — Telehealth: Payer: Self-pay | Admitting: Family Medicine

## 2016-02-21 ENCOUNTER — Ambulatory Visit (INDEPENDENT_AMBULATORY_CARE_PROVIDER_SITE_OTHER): Payer: 59 | Admitting: Family Medicine

## 2016-02-21 ENCOUNTER — Encounter: Payer: Self-pay | Admitting: Family Medicine

## 2016-02-21 VITALS — BP 111/71 | HR 69 | Temp 98.1°F | Resp 12 | Wt 165.2 lb

## 2016-02-21 DIAGNOSIS — Z0000 Encounter for general adult medical examination without abnormal findings: Secondary | ICD-10-CM

## 2016-02-21 DIAGNOSIS — Z1231 Encounter for screening mammogram for malignant neoplasm of breast: Secondary | ICD-10-CM | POA: Diagnosis not present

## 2016-02-21 DIAGNOSIS — Z1239 Encounter for other screening for malignant neoplasm of breast: Secondary | ICD-10-CM

## 2016-02-21 LAB — COMPREHENSIVE METABOLIC PANEL
ALBUMIN: 4.3 g/dL (ref 3.5–5.2)
ALT: 21 U/L (ref 0–35)
AST: 17 U/L (ref 0–37)
Alkaline Phosphatase: 50 U/L (ref 39–117)
BUN: 10 mg/dL (ref 6–23)
CHLORIDE: 103 meq/L (ref 96–112)
CO2: 29 mEq/L (ref 19–32)
CREATININE: 0.64 mg/dL (ref 0.40–1.20)
Calcium: 9.5 mg/dL (ref 8.4–10.5)
GFR: 109.28 mL/min (ref >60.00)
GLUCOSE: 88 mg/dL (ref 70–99)
POTASSIUM: 4.4 meq/L (ref 3.5–5.1)
Sodium: 138 mEq/L (ref 135–145)
Total Bilirubin: 1.5 mg/dL — ABNORMAL HIGH (ref 0.2–1.2)
Total Protein: 6.7 g/dL (ref 6.0–8.3)

## 2016-02-21 LAB — CBC
HCT: 41.1 % (ref 36.0–46.0)
Hemoglobin: 13.9 g/dL (ref 12.0–15.0)
MCHC: 33.8 g/dL (ref 30.0–36.0)
MCV: 93.9 fl (ref 78.0–100.0)
PLATELETS: 271 10*3/uL (ref 150.0–400.0)
RBC: 4.38 Mil/uL (ref 3.87–5.11)
RDW: 13.3 % (ref 11.5–15.5)
WBC: 5.5 10*3/uL (ref 4.0–10.5)

## 2016-02-21 LAB — LIPID PANEL
CHOL/HDL RATIO: 2
CHOLESTEROL: 214 mg/dL — AB (ref 0–200)
HDL: 94.3 mg/dL (ref >39.00)
LDL CALC: 109 mg/dL — AB (ref 0–99)
NonHDL: 119.41
Triglycerides: 53 mg/dL (ref 0.0–149.0)
VLDL: 10.6 mg/dL (ref 0.0–40.0)

## 2016-02-21 LAB — HEMOGLOBIN A1C: HEMOGLOBIN A1C: 5.1 % (ref 4.6–6.5)

## 2016-02-21 LAB — TSH: TSH: 0.77 u[IU]/mL (ref 0.35–4.50)

## 2016-02-21 MED ORDER — NORETHIN ACE-ETH ESTRAD-FE 1-20 MG-MCG(24) PO TABS: 1.0000 | ORAL_TABLET | Freq: Every day | ORAL | 4 refills | Status: DC

## 2016-02-21 NOTE — Addendum Note (Signed)
Addended by: Jennelle HumanNEWTON, ASHLEIGH E on: 02/21/2016 10:48 AM   Modules accepted: Orders

## 2016-02-21 NOTE — Patient Instructions (Signed)
Follow up annually.  Weight training and interval training.  Take care  Dr. Lacinda Axon   Health Maintenance, Female Introduction Adopting a healthy lifestyle and getting preventive care can go a long way to promote health and wellness. Talk with your health care provider about what schedule of regular examinations is right for you. This is a good chance for you to check in with your provider about disease prevention and staying healthy. In between checkups, there are plenty of things you can do on your own. Experts have done a lot of research about which lifestyle changes and preventive measures are most likely to keep you healthy. Ask your health care provider for more information. Weight and diet Eat a healthy diet  Be sure to include plenty of vegetables, fruits, low-fat dairy products, and lean protein.  Do not eat a lot of foods high in solid fats, added sugars, or salt.  Get regular exercise. This is one of the most important things you can do for your health.  Most adults should exercise for at least 150 minutes each week. The exercise should increase your heart rate and make you sweat (moderate-intensity exercise).  Most adults should also do strengthening exercises at least twice a week. This is in addition to the moderate-intensity exercise. Maintain a healthy weight  Body mass index (BMI) is a measurement that can be used to identify possible weight problems. It estimates body fat based on height and weight. Your health care provider can help determine your BMI and help you achieve or maintain a healthy weight.  For females 31 years of age and older:  A BMI below 18.5 is considered underweight.  A BMI of 18.5 to 24.9 is normal.  A BMI of 25 to 29.9 is considered overweight.  A BMI of 30 and above is considered obese. Watch levels of cholesterol and blood lipids  You should start having your blood tested for lipids and cholesterol at 40 years of age, then have this test  every 5 years.  You may need to have your cholesterol levels checked more often if:  Your lipid or cholesterol levels are high.  You are older than 40 years of age.  You are at high risk for heart disease. Cancer screening Lung Cancer  Lung cancer screening is recommended for adults 66-49 years old who are at high risk for lung cancer because of a history of smoking.  A yearly low-dose CT scan of the lungs is recommended for people who:  Currently smoke.  Have quit within the past 15 years.  Have at least a 30-pack-year history of smoking. A pack year is smoking an average of one pack of cigarettes a day for 1 year.  Yearly screening should continue until it has been 15 years since you quit.  Yearly screening should stop if you develop a health problem that would prevent you from having lung cancer treatment. Breast Cancer  Practice breast self-awareness. This means understanding how your breasts normally appear and feel.  It also means doing regular breast self-exams. Let your health care provider know about any changes, no matter how small.  If you are in your 20s or 30s, you should have a clinical breast exam (CBE) by a health care provider every 1-3 years as part of a regular health exam.  If you are 80 or older, have a CBE every year. Also consider having a breast X-ray (mammogram) every year.  If you have a family history of breast cancer, talk to  your health care provider about genetic screening.  If you are at high risk for breast cancer, talk to your health care provider about having an MRI and a mammogram every year.  Breast cancer gene (BRCA) assessment is recommended for women who have family members with BRCA-related cancers. BRCA-related cancers include:  Breast.  Ovarian.  Tubal.  Peritoneal cancers.  Results of the assessment will determine the need for genetic counseling and BRCA1 and BRCA2 testing. Cervical Cancer  Your health care provider may  recommend that you be screened regularly for cancer of the pelvic organs (ovaries, uterus, and vagina). This screening involves a pelvic examination, including checking for microscopic changes to the surface of your cervix (Pap test). You may be encouraged to have this screening done every 3 years, beginning at age 55.  For women ages 69-65, health care providers may recommend pelvic exams and Pap testing every 3 years, or they may recommend the Pap and pelvic exam, combined with testing for human papilloma virus (HPV), every 5 years. Some types of HPV increase your risk of cervical cancer. Testing for HPV may also be done on women of any age with unclear Pap test results.  Other health care providers may not recommend any screening for nonpregnant women who are considered low risk for pelvic cancer and who do not have symptoms. Ask your health care provider if a screening pelvic exam is right for you.  If you have had past treatment for cervical cancer or a condition that could lead to cancer, you need Pap tests and screening for cancer for at least 20 years after your treatment. If Pap tests have been discontinued, your risk factors (such as having a new sexual partner) need to be reassessed to determine if screening should resume. Some women have medical problems that increase the chance of getting cervical cancer. In these cases, your health care provider may recommend more frequent screening and Pap tests. Colorectal Cancer  This type of cancer can be detected and often prevented.  Routine colorectal cancer screening usually begins at 40 years of age and continues through 40 years of age.  Your health care provider may recommend screening at an earlier age if you have risk factors for colon cancer.  Your health care provider may also recommend using home test kits to check for hidden blood in the stool.  A small camera at the end of a tube can be used to examine your colon directly  (sigmoidoscopy or colonoscopy). This is done to check for the earliest forms of colorectal cancer.  Routine screening usually begins at age 45.  Direct examination of the colon should be repeated every 5-10 years through 40 years of age. However, you may need to be screened more often if early forms of precancerous polyps or small growths are found. Skin Cancer  Check your skin from head to toe regularly.  Tell your health care provider about any new moles or changes in moles, especially if there is a change in a mole's shape or color.  Also tell your health care provider if you have a mole that is larger than the size of a pencil eraser.  Always use sunscreen. Apply sunscreen liberally and repeatedly throughout the day.  Protect yourself by wearing long sleeves, pants, a wide-brimmed hat, and sunglasses whenever you are outside. Heart disease, diabetes, and high blood pressure  High blood pressure causes heart disease and increases the risk of stroke. High blood pressure is more likely to develop in:  People who have blood pressure in the high end of the normal range (130-139/85-89 mm Hg).  People who are overweight or obese.  People who are African American.  If you are 58-43 years of age, have your blood pressure checked every 3-5 years. If you are 61 years of age or older, have your blood pressure checked every year. You should have your blood pressure measured twice-once when you are at a hospital or clinic, and once when you are not at a hospital or clinic. Record the average of the two measurements. To check your blood pressure when you are not at a hospital or clinic, you can use:  An automated blood pressure machine at a pharmacy.  A home blood pressure monitor.  If you are between 29 years and 102 years old, ask your health care provider if you should take aspirin to prevent strokes.  Have regular diabetes screenings. This involves taking a blood sample to check your  fasting blood sugar level.  If you are at a normal weight and have a low risk for diabetes, have this test once every three years after 40 years of age.  If you are overweight and have a high risk for diabetes, consider being tested at a younger age or more often. Preventing infection Hepatitis B  If you have a higher risk for hepatitis B, you should be screened for this virus. You are considered at high risk for hepatitis B if:  You were born in a country where hepatitis B is common. Ask your health care provider which countries are considered high risk.  Your parents were born in a high-risk country, and you have not been immunized against hepatitis B (hepatitis B vaccine).  You have HIV or AIDS.  You use needles to inject street drugs.  You live with someone who has hepatitis B.  You have had sex with someone who has hepatitis B.  You get hemodialysis treatment.  You take certain medicines for conditions, including cancer, organ transplantation, and autoimmune conditions. Hepatitis C  Blood testing is recommended for:  Everyone born from [birth year] through 1964.  Anyone with known risk factors for hepatitis C. Sexually transmitted infections (STIs)  You should be screened for sexually transmitted infections (STIs) including gonorrhea and chlamydia if:  You are sexually active and are younger than 40 years of age.  You are older than 40 years of age and your health care provider tells you that you are at risk for this type of infection.  Your sexual activity has changed since you were last screened and you are at an increased risk for chlamydia or gonorrhea. Ask your health care provider if you are at risk.  If you do not have HIV, but are at risk, it may be recommended that you take a prescription medicine daily to prevent HIV infection. This is called pre-exposure prophylaxis (PrEP). You are considered at risk if:  You are sexually active and do not regularly use condoms or  know the HIV status of your partner(s).  You take drugs by injection.  You are sexually active with a partner who has HIV. Talk with your health care provider about whether you are at high risk of being infected with HIV. If you choose to begin PrEP, you should first be tested for HIV. You should then be tested every 3 months for as long as you are taking PrEP. Pregnancy  If you are premenopausal and you may become pregnant, ask your health care provider about  preconception counseling.  If you may become pregnant, take 400 to 800 micrograms (mcg) of folic acid every day.  If you want to prevent pregnancy, talk to your health care provider about birth control (contraception). Osteoporosis and menopause  Osteoporosis is a disease in which the bones lose minerals and strength with aging. This can result in serious bone fractures. Your risk for osteoporosis can be identified using a bone density scan.  If you are 65 years of age or older, or if you are at risk for osteoporosis and fractures, ask your health care provider if you should be screened.  Ask your health care provider whether you should take a calcium or vitamin D supplement to lower your risk for osteoporosis.  Menopause may have certain physical symptoms and risks.  Hormone replacement therapy may reduce some of these symptoms and risks. Talk to your health care provider about whether hormone replacement therapy is right for you. Follow these instructions at home:  Schedule regular health, dental, and eye exams.  Stay current with your immunizations.  Do not use any tobacco products including cigarettes, chewing tobacco, or electronic cigarettes.  If you are pregnant, do not drink alcohol.  If you are breastfeeding, limit how much and how often you drink alcohol.  Limit alcohol intake to no more than 1 drink per day for nonpregnant women. One drink equals 12 ounces of beer, 5 ounces of wine, or 1 ounces of hard  liquor.  Do not use street drugs.  Do not share needles.  Ask your health care provider for help if you need support or information about quitting drugs.  Tell your health care provider if you often feel depressed.  Tell your health care provider if you have ever been abused or do not feel safe at home. This information is not intended to replace advice given to you by your health care provider. Make sure you discuss any questions you have with your health care provider. Document Released: 08/01/2010 Document Revised: 06/24/2015 Document Reviewed: 10/20/2014  2017 [employer]vier  

## 2016-02-21 NOTE — Telephone Encounter (Signed)
Re-sent to corrected pharmacy

## 2016-02-21 NOTE — Telephone Encounter (Signed)
Pt wants to make sure that we send her rx to [facility].

## 2016-02-21 NOTE — Assessment & Plan Note (Signed)
Ordering mammogram for later this year. Preventative healthcare up-to-date. Labs today. Advised continued exercise regarding concerns for weight loss. Patient inquired about phentermine and I informed her that I do not feel the benefit outweighs the risk for her.

## 2016-02-21 NOTE — Progress Notes (Signed)
Subjective:  Patient ID: Colleen Rodriguez, female    DOB: May 04, 1975  Age: 41 y.o. MRN: [id number]  CC: Annual physical exam  HPI Colleen Rodriguez is a 41 y.o. female presents to the clinic today for an annual physical exam.  Preventative Healthcare  Pap smear: Up to date. Needs in 2019.  Mammogram: Will be needed later this year.  Immunizations  Tetanus - Up to date.   Flu - Up to date.   Labs: Screening labs today.  Exercise: Exercises regularly.   Alcohol use: See below.   Smoking/tobacco use: Former.   PMH, Surgical Hx, Family Hx, Social History reviewed and updated as below.  Past Medical History:  Diagnosis Date  . CIN 3 - cervical intraepithelial neoplasia grade 3    s/p LEEP  . HPV (human papilloma virus) infection   . Urinary tract infection    Past Surgical History:  Procedure Laterality Date  . VAGINAL DELIVERY     2   Family History  Problem Relation Age of Onset  . Stroke Paternal Grandfather    Social History  Substance Use Topics  . Smoking status: Former Smoker    Quit date: 01/30/2006  . Smokeless tobacco: Never Used  . Alcohol use Yes     Comment: occasional   Review of Systems General: Denies unexplained weight loss, fever. Skin: Denies new or changing mole, sore/wound that won't heal. ENT: Trouble hearing, ringing in the ears, sores in the mouth, hoarseness, trouble swallowing. Eyes: Denies trouble seeing/visual disturbance. Heart/CV: Denies chest pain, shortness of breath, edema, palpitations. Lungs/Resp: Denies cough, shortness of breath, hemoptysis. Abd/GI: Denies nausea, vomiting, diarrhea, constipation, abdominal pain, hematochezia, melena. GU: Denies dysuria, incontinence, hematuria, urinary frequency, difficulty starting/keeping stream, vaginal discharge, sexual difficulty, lump in breasts. MSK: Denies joint pain/swelling, myalgias. Neuro: Denies headaches, weakness, numbness, dizziness, syncope. Psych: Denies sadness, anxiety,  stress, memory difficulty. Endocrine: Denies polyuria and polydipsia.  Objective:   Today's Vitals: BP 111/71   Pulse 69   Temp 98.1 F (36.7 C) (Oral)   Resp 12   Wt 165 lb 3.2 oz (74.9 kg)   SpO2 100%   BMI 26.26 kg/m   Physical Exam  Constitutional: She is oriented to person, place, and time. She appears well-developed and well-nourished. No distress.  HENT:  Head: Normocephalic and atraumatic.  Nose: Nose normal.  Mouth/Throat: Oropharynx is clear and moist. No oropharyngeal exudate.  Normal TM's bilaterally.   Eyes: Conjunctivae are normal. No scleral icterus.  Neck: Neck supple.  Cardiovascular: Normal rate and regular rhythm.   No murmur heard. Pulmonary/Chest: Effort normal and breath sounds normal. She has no wheezes. She has no rales.  Abdominal: Soft. She exhibits no distension. There is no tenderness. There is no rebound and no guarding.  Musculoskeletal: Normal range of motion. She exhibits no edema.  Lymphadenopathy:    She has no cervical adenopathy.  Neurological: She is alert and oriented to person, place, and time.  Skin: Skin is warm and dry. No rash noted.  Psychiatric: She has a normal mood and affect.  Vitals reviewed.  Assessment & Plan:   Problem List Items Addressed This Visit    Annual physical exam - Primary    Ordering mammogram for later this year. Preventative healthcare up-to-date. Labs today. Advised continued exercise regarding concerns for weight loss. Patient inquired about phentermine and I informed her that I do not feel the benefit outweighs the risk for her.      Relevant Orders  CBC   Hemoglobin A1c   Comprehensive metabolic panel   Lipid panel   TSH    Other Visit Diagnoses    Screening for breast cancer       Relevant Orders   MM DIGITAL SCREENING BILATERAL      Outpatient Encounter Prescriptions as of 02/21/2016  Medication Sig  . albuterol (PROVENTIL HFA;VENTOLIN HFA) 108 (90 BASE) MCG/ACT inhaler Inhale 2 puffs  into the lungs every 6 (six) hours as needed.  . fexofenadine (ALLEGRA) 180 MG tablet Take 180 mg by mouth daily.  . fluticasone (FLONASE) 50 MCG/ACT nasal spray Place 2 sprays into the nose daily.  Marland Kitchen ibuprofen (ADVIL,MOTRIN) 800 MG tablet Take 800 mg by mouth every 8 (eight) hours as needed for mild pain (As needed for shoulder pain).  . Lysine 500 MG CAPS Take by mouth daily.  . Multiple Vitamin (MULTIVITAMIN) tablet Take 1 tablet by mouth daily.  . Norethindrone Acetate-Ethinyl Estrad-FE (LOESTRIN 24 FE) 1-20 MG-MCG(24) tablet Take 1 tablet by mouth daily.  . [DISCONTINUED] metroNIDAZOLE (FLAGYL) 500 MG tablet Take 1 tablet (500 mg total) by mouth 2 (two) times daily.  . [DISCONTINUED] Norethindrone Acetate-Ethinyl Estrad-FE (LOESTRIN 24 FE) 1-20 MG-MCG(24) tablet Take 1 tablet by mouth daily.   No facility-administered encounter medications on file as of 02/21/2016.     Follow-up: Annually  Everlene Other DO [facility]

## 2016-02-21 NOTE — Progress Notes (Signed)
Pre visit review using our clinic review tool, if applicable. No additional management support is needed unless otherwise documented below in the visit note. 

## 2016-02-22 ENCOUNTER — Encounter: Payer: Self-pay | Admitting: Family Medicine

## 2016-03-14 ENCOUNTER — Telehealth: Payer: 59 | Admitting: Family

## 2016-03-14 DIAGNOSIS — J0101 Acute recurrent maxillary sinusitis: Secondary | ICD-10-CM

## 2016-03-14 MED ORDER — AMOXICILLIN-POT CLAVULANATE 875-125 MG PO TABS: 1.0000 | ORAL_TABLET | Freq: Two times a day (BID) | ORAL | 0 refills | Status: DC

## 2016-03-14 NOTE — Progress Notes (Signed)

## 2018-08-14 ENCOUNTER — Ambulatory Visit (INDEPENDENT_AMBULATORY_CARE_PROVIDER_SITE_OTHER): Payer: 59 | Admitting: Family Medicine

## 2018-08-14 ENCOUNTER — Encounter: Payer: Self-pay | Admitting: Family Medicine

## 2018-08-14 ENCOUNTER — Other Ambulatory Visit: Payer: Self-pay

## 2018-08-14 ENCOUNTER — Telehealth: Payer: Self-pay | Admitting: Family Medicine

## 2018-08-14 DIAGNOSIS — F419 Anxiety disorder, unspecified: Secondary | ICD-10-CM

## 2018-08-14 DIAGNOSIS — G4700 Insomnia, unspecified: Secondary | ICD-10-CM | POA: Diagnosis not present

## 2018-08-14 MED ORDER — SERTRALINE HCL 25 MG PO TABS: 25.0000 mg | ORAL_TABLET | Freq: Every day | ORAL | 1 refills | Status: DC

## 2018-08-14 NOTE — Telephone Encounter (Signed)
Please schedule CPE with pap in about 4 weeks, we will also do labs that day (fasting) so AM appt preferred  Thanks  LG

## 2018-08-14 NOTE — Progress Notes (Signed)
Patient ID: Colleen Rodriguez, female   DOB: [birth year], 43 y.o.   MRN: [id number]    Virtual Visit via video Note  This visit type was conducted due to national recommendations for restrictions regarding the COVID-19 pandemic (e.g. social distancing).  This format is felt to be most appropriate for this patient at this time.  All issues noted in this document were discussed and addressed.  No physical exam was performed (except for noted visual exam findings with Video Visits).   I connected with Colleen Rodriguez today at  9:20 AM EDT by a video enabled telemedicine application or telephone and verified that I am speaking with the correct person using two identifiers. Location patient: home Location provider: work or home office Persons participating in the virtual visit: patient, provider  I discussed the limitations, risks, security and privacy concerns of performing an evaluation and management service by video and the availability of in person appointments. I also discussed with the patient that there may be a patient responsible charge related to this service. The patient expressed understanding and agreed to proceed.  HPI:  Patient and I connected via video so she can establish with new provider, she had previously been seen by Dr. Lacinda Axon and was last seen in our clinic in 2018.  Past medical, surgical, family and social history updated accordingly in chart.  Main concern today is anxiety, and not getting good sleep.  Feels like she has been more emotional lately and is not sure if this is related to the stress of the pandemic, or if she needs something for her anxiety.  Patient has battled with anxiety off and on for years and refers to her anxiety is "high functioning anxiety".  Patient states especially at night she cannot turn her thoughts off and will maybe only get 5 hours of restful sleep.  She has tried various sleep aids in the past such as Ambien or Benadryl but usually will feel groggy  in the next day so does not like these medications. No SI or HI.    ROS:  Constitutional: Negative for chills, fatigue and fever.  HENT: Negative for congestion, ear pain, sinus pain and sore throat.   Eyes: Negative.   Respiratory: Negative for cough, shortness of breath and wheezing.   Cardiovascular: Negative for chest pain, palpitations and leg swelling.  Gastrointestinal: Negative for abdominal pain, diarrhea, nausea and vomiting.  Genitourinary: Negative for dysuria, frequency and urgency.  Musculoskeletal: Negative for arthralgias and myalgias.  Skin: Negative for color change, pallor and rash.  Neurological: Negative for syncope, light-headedness and headaches.  Psychiatric/Behavioral: +anxiety/increased stress     Past Medical History:  Diagnosis Date  . Anxiety   . CIN 3 - cervical intraepithelial neoplasia grade 3    s/p LEEP  . HPV (human papilloma virus) infection   . Urinary tract infection     Past Surgical History:  Procedure Laterality Date  . VAGINAL DELIVERY     2    Family History  Problem Relation Age of Onset  . Stroke Paternal Grandfather    Social History   Tobacco Use  . Smoking status: Former Smoker    Quit date: 01/30/2006    Years since quitting: 12.5  . Smokeless tobacco: Never Used  Substance Use Topics  . Alcohol use: Yes    Comment: occasional    Current Outpatient Medications:  .  fexofenadine (ALLEGRA) 180 MG tablet, Take 180 mg by mouth daily., Disp: , Rfl:  .  fluticasone (  FLONASE) 50 MCG/ACT nasal spray, Place 2 sprays into the nose daily., Disp: 16 g, Rfl: 0 .  ibuprofen (ADVIL,MOTRIN) 800 MG tablet, Take 800 mg by mouth every 8 (eight) hours as needed for mild pain (As needed for shoulder pain)., Disp: , Rfl:  .  Lysine 500 MG CAPS, Take by mouth daily., Disp: , Rfl:  .  Multiple Vitamin (MULTIVITAMIN) tablet, Take 1 tablet by mouth daily., Disp: , Rfl:  .  Norethindrone Acetate-Ethinyl Estrad-FE (LOESTRIN 24 FE) 1-20  MG-MCG(24) tablet, Take 1 tablet by mouth daily., Disp: 3 Package, Rfl: 4  EXAM:  GENERAL: alert, oriented, appears well and in no acute distress  HEENT: atraumatic, conjunttiva clear, no obvious abnormalities on inspection of external nose and ears  NECK: normal movements of the head and neck  LUNGS: on inspection no signs of respiratory distress, breathing rate appears normal, no obvious gross SOB, gasping or wheezing  CV: no obvious cyanosis  MS: moves all visible extremities without noticeable abnormality  PSYCH/NEURO: pleasant and cooperative, no obvious depression or anxiety, speech and thought processing grossly intact  ASSESSMENT AND PLAN:  Discussed the following assessment and plan:  Anxiety and insomnia- patient is interested in trying a low-dose SSRI to see if this will help improve anxiety with in hopes of also improving insomnia.  We discussed different options and decided that Zoloft 25 mg daily at bedtime would be a good choice.  Patient also advised she can try low-dose melatonin of either 3 or 5 mg prior to bedtime to help aid in sleep.  Discussed techniques to help reduce anxiety including deep breathing, keeping self well-hydrated, getting exercise and eating a balanced diet.  Also discussed strategies to help improve insomnia like having a wind down routine, not using cell phone or watching television 30 minutes before bed, turning off the lights even in the hallway or night lights to help keep room dark.  We will plan to have patient follow-up here in approximately 4 weeks for complete physical exam including blood work, Pap smear and order for mammogram.  We will also touch base on how her anxiety and insomnia is responding to Zoloft treatment.   I discussed the assessment and treatment plan with the patient. The patient was provided an opportunity to ask questions and all were answered. The patient agreed with the plan and demonstrated an understanding of the  instructions.   The patient was advised to call back or seek an in-person evaluation if the symptoms worsen or if the condition fails to improve as anticipated.  I provided 15 minutes of video face-to-face time during this encounter.   Tracey HarriesLauren M Kevis Qu, FNP

## 2018-08-15 NOTE — Telephone Encounter (Signed)
Called Pt and scheduled her appt for 8/14 @8 :00am

## 2018-09-06 ENCOUNTER — Other Ambulatory Visit: Payer: Self-pay

## 2018-09-06 ENCOUNTER — Ambulatory Visit: Payer: Self-pay | Admitting: Physician Assistant

## 2018-09-06 VITALS — BP 110/80 | HR 86 | Temp 98.2°F | Resp 16 | Wt 171.0 lb

## 2018-09-06 DIAGNOSIS — B029 Zoster without complications: Secondary | ICD-10-CM

## 2018-09-06 MED ORDER — FAMCICLOVIR 500 MG PO TABS
500.0000 mg | ORAL_TABLET | Freq: Three times a day (TID) | ORAL | 0 refills | Status: DC
Start: 2018-09-06 — End: 2018-09-13

## 2018-09-06 NOTE — Progress Notes (Signed)
Patient ID: [name] DOB: [birth year] AGE: 43 y.o. MRN: [id number]   PCP: Coral Spikes, DO   Chief Complaint:  Chief Complaint  Patient presents with  . Rash    x4d     Subjective:    HPI:  Colleen Rodriguez is a 43 y.o. female presents for evaluation  Chief Complaint  Patient presents with  . Rash    x40d    43 year old female [facility] to [facility] with five day history of rash. Began as mild pruritis and small circular area of erythema. Over next several days, enlarged in size. Developed burning pain. Associated skin sensitivity. Has taken OTC Tylenol and ibuprofen with minimal relief. Has applied OTC hydrocortisone cream and neosporin ointment with mild decrease in burning sensation, for short duration. Denies known new exposure; moisturizer, clothing, clothing detergent. Denies recent weeding/gardening or hiking for plant exposure. Denies known insect bite/sting. Denies close contact with similar rash. No previous history of similar rash. No previous history of Herpes Zoster. Patient did have Chickenpox as a child. Denies fever, chills, sweats, headache, URI symptoms, chest pain, SOB, wheezing, abdominal pain, nausea/vomiting.  A limited review of symptoms was performed, pertinent positives and negatives as mentioned in HPI.  The following portions of the patient's history were reviewed and updated as appropriate: allergies, current medications and past medical history.  Patient Active Problem List   Diagnosis Date Noted  . Annual physical exam 12/06/2012  . Contraception 06/12/2011  . Seasonal allergies 06/12/2011    No Known Allergies  Current Outpatient Medications on File Prior to Visit  Medication Sig Dispense Refill  . sertraline (ZOLOFT) 25 MG tablet Take 1 tablet (25 mg total) by mouth at bedtime. 30 tablet 1   No current facility-administered medications on file prior to visit.        Objective:   Vitals:   09/06/18 1215  BP: 110/80   Pulse: 86  Resp: 16  Temp: 98.2 F (36.8 C)  SpO2: 99%     Wt Readings from Last 3 Encounters:  09/06/18 171 lb (77.6 kg)  02/21/16 165 lb 3.2 oz (74.9 kg)  09/09/14 168 lb (76.2 kg)    Physical Exam:   General Appearance:  Patient sitting comfortably on examination table. Conversational. Kermit Balo self-historian. In no acute distress. Afebrile.   Head:  Normocephalic, without obvious abnormality, atraumatic  Eyes:  PERRL, conjunctiva/corneas clear, EOM's intact  Neck: Supple, symmetrical, trachea midline, no adenopathy  Lungs:   Clear to auscultation bilaterally, respirations unlabored. Good aeration. No rales, rhonchi, crackles or wheezing.  Heart:  Regular rate and rhythm, S1 and S2 normal, no murmur, rub, or gallop  Extremities: Extremities normal, atraumatic, no cyanosis or edema  Pulses: 2+ and symmetric  Skin: Left lower back, few centimeters lateral/distal from midline, reveals cluster of vesicles (active serosanguinous/clear drainage) on erythematous base. Entire rash 2cm x 5cm ovular area. Mild tenderness with palpation. No crusted lesions at this time. No streaking redness. No bleeding or purulent drainage. Single erythematous papule on right side of low back; unsure if associated with rest of rash.  Lymph nodes: Cervical, supraclavicular, and axillary nodes normal  Neurologic: Normal   Picture of rash in media section.  Assessment & Plan:    Exam findings, diagnosis etiology and medication use and indications reviewed with patient. Follow-Up and discharge instructions provided. No emergent/urgent issues found on exam.  Patient education was provided.   Patient verbalized understanding of information provided and agrees with plan of  care (POC), all questions answered. The patient is advised to call or return to clinic if condition does not see an improvement in symptoms, or to seek the care of the closest emergency department if condition worsens with the below plan.     1. Herpes zoster without complication - famciclovir (FAMVIR) 500 MG tablet; Take 1 tablet (500 mg total) by mouth 3 (three) times daily for 7 days.  Dispense: 21 tablet; Refill: 470  43 year old female presents with 5 day history of erythematous vesicular rash on left low back. History and presentation/physical exam suggestive of Herpes Zoster. Differential includes insect bite/sting, impetigo, cellulitis, herpes simplex, tinea corporis, etc. Prescribed 7-day course of Favir 500mg  tid. Advised ice, OTC hydrocortisone cream, and may try OTC Zostrix cream. Discussed contagiousness. Patient works in non-patient care area of cancer center; should be able to work, however, advised employee discuss with Production designer, theatre/television/filmmanager. Discussed possibility of post-herpetic neuralgia. Discussed typical course of Herpes Zoster. Advised patient follow-up with PCP or urgent care in one week if rash not improving, sooner with any new or concerning symptoms including fever, chills, nausea/vomiting, cough, chest pain, SOB, etc. Patient agreed with plan.   Janalyn HarderSamantha Arneta Mahmood, MHS, PA-C Rulon SeraSamantha F. Kimbely Whiteaker, MHS, PA-C Advanced Practice Provider [facility]  [facility]  [phone] [place], [place] [address] (p): [phone] [email or web address]

## 2018-09-06 NOTE — Patient Instructions (Signed)
Thank you for choosing [facility] for your health care needs.  You have been diagnosed with SHINGLES 1. Herpes zoster without complication - famciclovir (FAMVIR) 500 MG tablet; Take 1 tablet (500 mg total) by mouth 3 (three) times daily for 7 days.  Dispense: 21 tablet; Refill: 0  May use over the counter Tylenol or ibuprofen for pain/discomfort. May apply ice. May use over the counter Hydrocortisone cream and/or antibiotic ointment (such as Bacitracin).  The fluid in your lesions are contagious to someone who is not immune to chicken pox (pregnant women or children who have not received their varicella vaccinations yet or severely immunocompromised people).  Follow-up with family physician or urgent care in one week if symptoms not improving. Sooner with worsening symptoms such as fever, chills, worsening rash, chest pain, shortness of breath, or other new/concerning symptoms.  Shingles  Shingles, which is also known as herpes zoster, is an infection that causes a painful skin rash and fluid-filled blisters. It is caused by a virus. Shingles only develops in people who:  Have had chickenpox.  Have been given a medicine to protect against chickenpox (have been vaccinated). Shingles is rare in this group. What are the causes? Shingles is caused by varicella-zoster virus (VZV). This is the same virus that causes chickenpox. After a person is exposed to VZV, the virus stays in the body in an inactive (dormant) state. Shingles develops if the virus is reactivated. This can happen many years after the first (initial) exposure to VZV. It is not known what causes this virus to be reactivated. What increases the risk? People who have had chickenpox or received the chickenpox vaccine are at risk for shingles. Shingles infection is more common in people who:  Are older than age 42.  Have a weakened disease-fighting system (immune system), such as people with: ? HIV. ? AIDS. ? Cancer.  Are  taking medicines that weaken the immune system, such as transplant medicines.  Are experiencing a lot of stress. What are the signs or symptoms? Early symptoms of this condition include itching, tingling, and pain in an area on your skin. Pain may be described as burning, stabbing, or throbbing. A few days or weeks after early symptoms start, a painful red rash appears. The rash is usually on one side of the body and has a band-like or belt-like pattern. The rash eventually turns into fluid-filled blisters that break open, change into scabs, and dry up in about 2-3 weeks. At any time during the infection, you may also develop:  A fever.  Chills.  A headache.  An upset stomach. How is this diagnosed? This condition is diagnosed with a skin exam. Skin or fluid samples may be taken from the blisters before a diagnosis is made. These samples are examined under a microscope or sent to a lab for testing. How is this treated? The rash may last for several weeks. There is not a specific cure for this condition. Your health care provider will probably prescribe medicines to help you manage pain, recover more quickly, and avoid long-term problems. Medicines may include:  Antiviral drugs.  Anti-inflammatory drugs.  Pain medicines.  Anti-itching medicines (antihistamines). If the area involved is on your face, you may be referred to a specialist, such as an eye doctor (ophthalmologist) or an ear, nose, and throat (ENT) doctor (otolaryngologist) to help you avoid eye problems, chronic pain, or disability. Follow these instructions at home: Medicines  Take over-the-counter and prescription medicines only as told by your health care  provider.  Apply an anti-itch cream or numbing cream to the affected area as told by your health care provider. Relieving itching and discomfort   Apply cold, wet cloths (cold compresses) to the area of the rash or blisters as told by your health care provider.   Cool baths can be soothing. Try adding baking soda or dry oatmeal to the water to reduce itching. Do not bathe in hot water. Blister and rash care  Keep your rash covered with a loose bandage (dressing). Wear loose-fitting clothing to help ease the pain of material rubbing against the rash.  Keep your rash and blisters clean by washing the area with mild soap and cool water as told by your health care provider.  Check your rash every day for signs of infection. Check for: ? More redness, swelling, or pain. ? Fluid or blood. ? Warmth. ? Pus or a bad smell.  Do not scratch your rash or pick at your blisters. To help avoid scratching: ? Keep your fingernails clean and cut short. ? Wear gloves or mittens while you sleep, if scratching is a problem. General instructions  Rest as told by your health care provider.  Keep all follow-up visits as told by your health care provider. This is important.  Wash your hands often with soap and water. If soap and water are not available, use hand sanitizer. Doing this lowers your chance of getting a bacterial skin infection.  Before your blisters change into scabs, your shingles infection can cause chickenpox in people who have never had it or have never been vaccinated against it. To prevent this from happening, avoid contact with other people, especially: ? Babies. ? Pregnant women. ? Children who have eczema. ? Elderly people who have transplants. ? People who have chronic illnesses, such as cancer or AIDS. Contact a health care provider if:  Your pain is not relieved with prescribed medicines.  Your pain does not get better after the rash heals.  You have signs of infection in the rash area, such as: ? More redness, swelling, or pain around the rash. ? Fluid or blood coming from the rash. ? The rash area feeling warm to the touch. ? Pus or a bad smell coming from the rash. Get help right away if:  The rash is on your face or nose.  You  have facial pain, pain around your eye area, or loss of feeling on one side of your face.  You have difficulty seeing.  You have ear pain or have ringing in your ear.  You have a loss of taste.  Your condition gets worse. Summary  Shingles, which is also known as herpes zoster, is an infection that causes a painful skin rash and fluid-filled blisters.  This condition is diagnosed with a skin exam. Skin or fluid samples may be taken from the blisters and examined before the diagnosis is made.  Keep your rash covered with a loose bandage (dressing). Wear loose-fitting clothing to help ease the pain of material rubbing against the rash.  Before your blisters change into scabs, your shingles infection can cause chickenpox in people who have never had it or have never been vaccinated against it. This information is not intended to replace advice given to you by your health care provider. Make sure you discuss any questions you have with your health care provider. Document Released: 01/16/2005 Document Revised: 05/10/2018 Document Reviewed: 09/20/2016 Elsevier Patient Education  2020 [employer].

## 2018-09-10 ENCOUNTER — Other Ambulatory Visit: Payer: Self-pay

## 2018-09-10 ENCOUNTER — Telehealth: Payer: Self-pay | Admitting: Emergency Medicine

## 2018-09-10 NOTE — Telephone Encounter (Signed)
Spoke with patient whom informed me that she feels a little bettter still taking medication. This was a follow up call from [facility]

## 2018-09-13 ENCOUNTER — Other Ambulatory Visit (HOSPITAL_COMMUNITY)
Admission: RE | Admit: 2018-09-13 | Discharge: 2018-09-13 | Disposition: A | Discharge status: Home / Self Care | Payer: 59 | Source: Ambulatory Visit | Attending: Family Medicine | Admitting: Family Medicine

## 2018-09-13 ENCOUNTER — Ambulatory Visit (INDEPENDENT_AMBULATORY_CARE_PROVIDER_SITE_OTHER): Payer: 59 | Admitting: Family Medicine

## 2018-09-13 ENCOUNTER — Other Ambulatory Visit: Payer: Self-pay

## 2018-09-13 VITALS — BP 100/62 | HR 84 | Temp 98.4°F | Resp 16 | Ht 67.0 in | Wt 173.8 lb

## 2018-09-13 DIAGNOSIS — Z0000 Encounter for general adult medical examination without abnormal findings: Secondary | ICD-10-CM

## 2018-09-13 DIAGNOSIS — B373 Candidiasis of vulva and vagina: Secondary | ICD-10-CM | POA: Diagnosis not present

## 2018-09-13 DIAGNOSIS — Z124 Encounter for screening for malignant neoplasm of cervix: Secondary | ICD-10-CM | POA: Diagnosis not present

## 2018-09-13 DIAGNOSIS — B3731 Acute candidiasis of vulva and vagina: Secondary | ICD-10-CM

## 2018-09-13 DIAGNOSIS — F419 Anxiety disorder, unspecified: Secondary | ICD-10-CM | POA: Diagnosis not present

## 2018-09-13 DIAGNOSIS — Z1231 Encounter for screening mammogram for malignant neoplasm of breast: Secondary | ICD-10-CM

## 2018-09-13 DIAGNOSIS — G4700 Insomnia, unspecified: Secondary | ICD-10-CM | POA: Diagnosis not present

## 2018-09-13 MED ORDER — SERTRALINE HCL 25 MG PO TABS: 50.0000 mg | ORAL_TABLET | Freq: Every day | ORAL | 2 refills | Status: DC

## 2018-09-13 NOTE — Progress Notes (Signed)
Subjective:    Patient ID: Colleen Rodriguez, female    DOB: Dec 09, 1975, 43 y.o.   MRN: [id number]  HPI   Patient presents to clinic for complete physical exam with Pap smear and also to follow-up on anxiety and insomnia.  Overall she is feeling well.  She has never had a mammogram.  We will get this ordered.  She has never had abnormal Pap smear. No cancer history in her family.    Does see eye doctor every 1 to 2 years and dentist every 6 months.  Tries to eat healthy diet and exercise regularly, has not exercised as much lately due to gyms being closed.  Tries to go for walks a couple of times a week.  Since taking the Zoloft, has noticed her mood has been more level, and has been sleeping better.  Does not feel as nervous or stressed.  Denies any SI or HI.  Patient Active Problem List   Diagnosis Date Noted  . Annual physical exam 12/06/2012  . Contraception 06/12/2011  . Seasonal allergies 06/12/2011   Social History   Tobacco Use  . Smoking status: Former Smoker    Quit date: 01/30/2006    Years since quitting: 12.6  . Smokeless tobacco: Never Used  Substance Use Topics  . Alcohol use: Yes    Comment: occasional   Family History  Problem Relation Age of Onset  . Stroke Paternal Grandfather    Past Surgical History:  Procedure Laterality Date  . VAGINAL DELIVERY     2   Review of Systems  Constitutional: Negative for chills, fatigue and fever.  HENT: Negative for congestion, ear pain, sinus pain and sore throat.   Eyes: Negative.   Respiratory: Negative for cough, shortness of breath and wheezing.   Cardiovascular: Negative for chest pain, palpitations and leg swelling.  Gastrointestinal: Negative for abdominal pain, diarrhea, nausea and vomiting.  Genitourinary: Negative for dysuria, frequency and urgency.  Musculoskeletal: Negative for arthralgias and myalgias.  Skin: Negative for color change, pallor and rash.  Neurological: Negative for syncope,  light-headedness and headaches.  Psychiatric/Behavioral: The patient is not nervous/anxious.       Objective:   Physical Exam Vitals signs and nursing note reviewed.  Constitutional:      Appearance: She is well-developed.  HENT:     Head: Normocephalic and atraumatic.     Right Ear: Tympanic membrane, ear canal and external ear normal.     Left Ear: Tympanic membrane, ear canal and external ear normal.     Nose: Nose normal.  Eyes:     General: No scleral icterus.       Right eye: No discharge.        Left eye: No discharge.     Extraocular Movements: Extraocular movements intact.     Conjunctiva/sclera: Conjunctivae normal.     Pupils: Pupils are equal, round, and reactive to light.  Neck:     Musculoskeletal: Normal range of motion and neck supple. No neck rigidity.     Trachea: No tracheal deviation.  Cardiovascular:     Rate and Rhythm: Normal rate and regular rhythm.     Heart sounds: Normal heart sounds.  Pulmonary:     Effort: Pulmonary effort is normal. No respiratory distress.     Breath sounds: Normal breath sounds. No wheezing or rales.  Chest:     Chest wall: No tenderness.     Breasts:        Right: No swelling, bleeding,  inverted nipple, mass, nipple discharge, skin change or tenderness.        Left: No swelling, bleeding, inverted nipple, mass, nipple discharge, skin change or tenderness.  Abdominal:     General: Bowel sounds are normal. There is no distension.     Palpations: Abdomen is soft. There is no mass.     Tenderness: There is no abdominal tenderness. There is no right CVA tenderness, left CVA tenderness, guarding or rebound.     Hernia: There is no hernia in the left inguinal area or right inguinal area.  Genitourinary:    General: Normal vulva.     Pubic Area: No rash.      Labia:        Right: No rash, tenderness, lesion or injury.        Left: No rash, tenderness, lesion or injury.      Cervix: Normal.     Uterus: Normal. Not tender.       Adnexa:        Right: No tenderness.         Left: No tenderness.       Comments: Pap collected and sent to lab Musculoskeletal: Normal range of motion.        General: No deformity.     Right lower leg: No edema.     Left lower leg: No edema.  Lymphadenopathy:     Cervical: No cervical adenopathy.     Upper Body:     Right upper body: No supraclavicular, axillary or pectoral adenopathy.     Left upper body: No supraclavicular, axillary or pectoral adenopathy.     Lower Body: No right inguinal adenopathy. No left inguinal adenopathy.  Skin:    General: Skin is warm and dry.     Capillary Refill: Capillary refill takes less than 2 seconds.     Coloration: Skin is not pale.     Findings: No erythema.  Neurological:     General: No focal deficit present.     Mental Status: She is alert and oriented to person, place, and time.     Cranial Nerves: No cranial nerve deficit.  Psychiatric:        Mood and Affect: Mood normal.        Behavior: Behavior normal.        Thought Content: Thought content normal.        Judgment: Judgment normal.    Today's Vitals   09/13/18 0813  BP: 100/62  Pulse: 84  Resp: 16  Temp: 98.4 F (36.9 C)  TempSrc: Oral  SpO2: 98%  Weight: 173 lb 12.8 oz (78.8 kg)  Height: 5\' 7"  (1.702 m)   Body mass index is 27.22 kg/m.    Assessment & Plan:    Well adult exam-patient appears to be a healthy 43 year old female.  We will plan to get fasting blood work at a later date due to lab being closed in office today.  We will get mammogram scheduled.  Pap smear collected and sent to lab.  Reviewed healthy diet and regular exercise, recommended diet high in lean proteins, various fruits and vegetables and good water intake.  Discussed physical activity at least 3 to 5 days a week including walking, aerobic activity and lifting small weights.  Recommended safe sun practices including wearing SPF of at least 30 when outdoors, also discussed wearing wide brim hat and  long sleeves when going to be in sunlight for many hours to protect skin.  She always  wears seatbelt in car.  Sees dentist and eye doctor regularly.  Anxiety and insomnia-does feel improved on the Zoloft, but wonders if it could be more.  We will trial increasing dose to 50 mg at bedtime to see if these slight dose is increase helps patient to have better overall mood control and improvement in sleeping.  Patient will follow-up in approximately 4 to 6 weeks for recheck on Zoloft dose increase.  She is aware she can return to clinic sooner if any issues arise.

## 2018-09-16 ENCOUNTER — Telehealth: Payer: Self-pay

## 2018-09-16 NOTE — Telephone Encounter (Signed)
Patient did not answer the phone.

## 2018-09-17 LAB — CYTOLOGY - PAP: Diagnosis: NEGATIVE

## 2018-09-17 MED ORDER — FLUCONAZOLE 150 MG PO TABS: 150.0000 mg | ORAL_TABLET | Freq: Once | ORAL | 0 refills | Status: AC

## 2018-09-17 NOTE — Addendum Note (Signed)
Addended by: Philis Nettle on: 09/17/2018 04:22 PM   Modules accepted: Orders

## 2018-09-23 ENCOUNTER — Ambulatory Visit
Admission: RE | Admit: 2018-09-23 | Discharge: 2018-09-23 | Disposition: A | Discharge status: Home / Self Care | Payer: 59 | Source: Ambulatory Visit | Attending: Family Medicine | Admitting: Family Medicine

## 2018-09-23 ENCOUNTER — Other Ambulatory Visit: Payer: Self-pay

## 2018-09-23 DIAGNOSIS — Z1231 Encounter for screening mammogram for malignant neoplasm of breast: Secondary | ICD-10-CM | POA: Insufficient documentation

## 2018-09-24 ENCOUNTER — Other Ambulatory Visit: Payer: Self-pay | Admitting: Family Medicine

## 2018-09-24 DIAGNOSIS — R928 Other abnormal and inconclusive findings on diagnostic imaging of breast: Secondary | ICD-10-CM

## 2018-10-08 ENCOUNTER — Other Ambulatory Visit: Payer: Self-pay

## 2018-10-08 ENCOUNTER — Other Ambulatory Visit (INDEPENDENT_AMBULATORY_CARE_PROVIDER_SITE_OTHER): Payer: 59

## 2018-10-08 DIAGNOSIS — Z0000 Encounter for general adult medical examination without abnormal findings: Secondary | ICD-10-CM

## 2018-10-08 LAB — CBC WITH DIFFERENTIAL/PLATELET
Basophils Absolute: 0.1 10*3/uL (ref 0.0–0.1)
Basophils Relative: 1 % (ref 0.0–3.0)
Eosinophils Absolute: 0.2 10*3/uL (ref 0.0–0.7)
Eosinophils Relative: 2.4 % (ref 0.0–5.0)
HCT: 34.1 % — ABNORMAL LOW (ref 36.0–46.0)
Hemoglobin: 11 g/dL — ABNORMAL LOW (ref 12.0–15.0)
Lymphocytes Relative: 26 % (ref 12.0–46.0)
Lymphs Abs: 1.7 10*3/uL (ref 0.7–4.0)
MCHC: 32.3 g/dL (ref 30.0–36.0)
MCV: 88.6 fl (ref 78.0–100.0)
Monocytes Absolute: 0.6 10*3/uL (ref 0.1–1.0)
Monocytes Relative: 9.1 % (ref 3.0–12.0)
Neutro Abs: 4 10*3/uL (ref 1.4–7.7)
Neutrophils Relative %: 61.5 % (ref 43.0–77.0)
Platelets: 305 10*3/uL (ref 150.0–400.0)
RBC: 3.85 Mil/uL — ABNORMAL LOW (ref 3.87–5.11)
RDW: 15.7 % — ABNORMAL HIGH (ref 11.5–15.5)
WBC: 6.5 10*3/uL (ref 4.0–10.5)

## 2018-10-08 LAB — LIPID PANEL
Cholesterol: 221 mg/dL — ABNORMAL HIGH (ref 0–200)
HDL: 105.8 mg/dL (ref >39.00)
LDL Cholesterol: 103 mg/dL — ABNORMAL HIGH (ref 0–99)
NonHDL: 115.63
Total CHOL/HDL Ratio: 2
Triglycerides: 62 mg/dL (ref 0.0–149.0)
VLDL: 12.4 mg/dL (ref 0.0–40.0)

## 2018-10-08 LAB — COMPREHENSIVE METABOLIC PANEL
ALT: 17 U/L (ref 0–35)
AST: 16 U/L (ref 0–37)
Albumin: 4.1 g/dL (ref 3.5–5.2)
Alkaline Phosphatase: 63 U/L (ref 39–117)
BUN: 11 mg/dL (ref 6–23)
CO2: 26 mEq/L (ref 19–32)
Calcium: 9.1 mg/dL (ref 8.4–10.5)
Chloride: 103 mEq/L (ref 96–112)
Creatinine, Ser: 0.69 mg/dL (ref 0.40–1.20)
GFR: 93.06 mL/min (ref >60.00)
Glucose, Bld: 98 mg/dL (ref 70–99)
Potassium: 4.2 mEq/L (ref 3.5–5.1)
Sodium: 138 mEq/L (ref 135–145)
Total Bilirubin: 0.7 mg/dL (ref 0.2–1.2)
Total Protein: 6.7 g/dL (ref 6.0–8.3)

## 2018-10-08 LAB — B12 AND FOLATE PANEL
Folate: 23.7 ng/mL (ref >5.9)
Vitamin B-12: 269 pg/mL (ref 211–911)

## 2018-10-08 LAB — VITAMIN D 25 HYDROXY (VIT D DEFICIENCY, FRACTURES): VITD: 22.94 ng/mL — ABNORMAL LOW (ref 30.00–100.00)

## 2018-10-09 LAB — THYROID PANEL WITH TSH
Free Thyroxine Index: 1.4 (ref 1.4–3.8)
T3 Uptake: 25 % (ref 22–35)
T4, Total: 5.5 ug/dL (ref 5.1–11.9)
TSH: 16.7 mIU/L — ABNORMAL HIGH

## 2018-10-11 ENCOUNTER — Ambulatory Visit (INDEPENDENT_AMBULATORY_CARE_PROVIDER_SITE_OTHER): Payer: 59 | Admitting: Family Medicine

## 2018-10-11 ENCOUNTER — Ambulatory Visit
Admission: RE | Admit: 2018-10-11 | Discharge: 2018-10-11 | Disposition: A | Discharge status: Home / Self Care | Payer: 59 | Source: Ambulatory Visit | Attending: Family Medicine | Admitting: Family Medicine

## 2018-10-11 ENCOUNTER — Other Ambulatory Visit: Payer: Self-pay

## 2018-10-11 DIAGNOSIS — E039 Hypothyroidism, unspecified: Secondary | ICD-10-CM | POA: Diagnosis not present

## 2018-10-11 DIAGNOSIS — R928 Other abnormal and inconclusive findings on diagnostic imaging of breast: Secondary | ICD-10-CM | POA: Insufficient documentation

## 2018-10-11 DIAGNOSIS — N6313 Unspecified lump in the right breast, lower outer quadrant: Secondary | ICD-10-CM | POA: Diagnosis not present

## 2018-10-11 DIAGNOSIS — G4700 Insomnia, unspecified: Secondary | ICD-10-CM

## 2018-10-11 DIAGNOSIS — F419 Anxiety disorder, unspecified: Secondary | ICD-10-CM

## 2018-10-11 DIAGNOSIS — D649 Anemia, unspecified: Secondary | ICD-10-CM | POA: Diagnosis not present

## 2018-10-11 MED ORDER — LEVOTHYROXINE SODIUM 50 MCG PO TABS: 50.0000 ug | ORAL_TABLET | Freq: Every day | ORAL | 0 refills | Status: DC

## 2018-10-11 NOTE — Progress Notes (Signed)
Patient ID: Colleen Rodriguez, female   DOB: Jun 01, 1975, 43 y.o.   MRN: [id number]    Virtual Visit via video Note  This visit type was conducted due to national recommendations for restrictions regarding the COVID-19 pandemic (e.g. social distancing).  This format is felt to be most appropriate for this patient at this time.  All issues noted in this document were discussed and addressed.  No physical exam was performed (except for noted visual exam findings with Video Visits).   I connected with Estevan Oaks today at  8:40 AM EDT by a video enabled telemedicine application and verified that I am speaking with the correct person using two identifiers. Location patient: home Location provider: work or home office Persons participating in the virtual visit: patient, provider  I discussed the limitations, risks, security and privacy concerns of performing an evaluation and management service by video and the availability of in person appointments. I also discussed with the patient that there may be a patient responsible charge related to this service. The patient expressed understanding and agreed to proceed.   HPI:  Patient presents to clinic to follow-up on anxiety after increasing Zoloft from 25 mg to 50 mg/day and also to go over lab work.  Feeling great with Zoloft increase.  Feels her mood is more stable and is getting better sleep at night.  Lab work revealed that she is in hypothyroidism.  Also shows she has some mild anemia; denies any bleeding and stools or from nose states she does have some heavier periods at times.    ROS:   Constitutional: Negative for chills, fatigue and fever.  HENT: Negative for congestion, ear pain, sinus pain and sore throat.   Eyes: Negative.   Respiratory: Negative for cough, shortness of breath and wheezing.   Cardiovascular: Negative for chest pain, palpitations and leg swelling.  Gastrointestinal: Negative for abdominal pain, diarrhea, nausea and  vomiting.  Genitourinary: Negative for dysuria, frequency and urgency.  Musculoskeletal: Negative for arthralgias and myalgias.  Skin: Negative for color change, pallor and rash.  Neurological: Negative for syncope, light-headedness and headaches.  Psychiatric/Behavioral: The patient is not nervous/anxious.     Past Medical History:  Diagnosis Date  . Anxiety   . CIN 3 - cervical intraepithelial neoplasia grade 3    s/p LEEP  . HPV (human papilloma virus) infection   . Urinary tract infection     Past Surgical History:  Procedure Laterality Date  . VAGINAL DELIVERY     2    Family History  Problem Relation Age of Onset  . Stroke Paternal Grandfather   . Breast cancer Neg Hx     Social History   Tobacco Use  . Smoking status: Former Smoker    Quit date: 01/30/2006    Years since quitting: 12.7  . Smokeless tobacco: Never Used  Substance Use Topics  . Alcohol use: Yes    Comment: occasional    Current Outpatient Medications:  .  sertraline (ZOLOFT) 25 MG tablet, Take 2 tablets (50 mg total) by mouth at bedtime., Disp: 60 tablet, Rfl: 2 .  levothyroxine (SYNTHROID) 50 MCG tablet, Take 1 tablet (50 mcg total) by mouth daily., Disp: 90 tablet, Rfl: 0  EXAM:  GENERAL: alert, oriented, appears well and in no acute distress  HEENT: atraumatic, conjunttiva clear, no obvious abnormalities on inspection of external nose and ears  NECK: normal movements of the head and neck  LUNGS: on inspection no signs of respiratory distress, breathing rate  appears normal, no obvious gross SOB, gasping or wheezing  CV: no obvious cyanosis  MS: moves all visible extremities without noticeable abnormality  PSYCH/NEURO: pleasant and cooperative, no obvious depression or anxiety, speech and thought processing grossly intact  ASSESSMENT AND PLAN:  Discussed the following assessment and plan:  1. Anemia, unspecified type Will recheck CBC and add iron level  - CBC w/Diff; Future -  IBC + Ferritin; Future  2. Acquired hypothyroidism Start levothyroxine. Will recheck labs in 6-8 weeks  - levothyroxine (SYNTHROID) 50 MCG tablet; Take 1 tablet (50 mcg total) by mouth daily.  Dispense: 90 tablet; Refill: 0 - TSH; Future  3. Anxiety Continue sertraline at HS  4. Insomnia, unspecified type Continue sertraline at HS  I discussed the assessment and treatment plan with the patient. The patient was provided an opportunity to ask questions and all were answered. The patient agreed with the plan and demonstrated an understanding of the instructions.   The patient was advised to call back or seek an in-person evaluation if the symptoms worsen or if the condition fails to improve as anticipated.  I provided 25 minutes of video-face-to-face time during this encounter.   Follow up scheduled for lab/meds follow ups.  Tracey HarriesLauren M Sherrina Zaugg, FNP

## 2018-10-14 NOTE — Progress Notes (Signed)
Follow up mammogram/US orders placed for 6 month recheck

## 2018-11-13 ENCOUNTER — Other Ambulatory Visit: Payer: Self-pay

## 2018-11-13 ENCOUNTER — Other Ambulatory Visit (INDEPENDENT_AMBULATORY_CARE_PROVIDER_SITE_OTHER): Payer: 59

## 2018-11-13 DIAGNOSIS — D509 Iron deficiency anemia, unspecified: Secondary | ICD-10-CM

## 2018-11-13 DIAGNOSIS — E039 Hypothyroidism, unspecified: Secondary | ICD-10-CM | POA: Diagnosis not present

## 2018-11-13 DIAGNOSIS — D649 Anemia, unspecified: Secondary | ICD-10-CM | POA: Diagnosis not present

## 2018-11-13 LAB — CBC WITH DIFFERENTIAL/PLATELET
Basophils Absolute: 0.1 10*3/uL (ref 0.0–0.1)
Basophils Relative: 0.8 % (ref 0.0–3.0)
Eosinophils Absolute: 0.3 10*3/uL (ref 0.0–0.7)
Eosinophils Relative: 3.4 % (ref 0.0–5.0)
HCT: 34.1 % — ABNORMAL LOW (ref 36.0–46.0)
Hemoglobin: 11.2 g/dL — ABNORMAL LOW (ref 12.0–15.0)
Lymphocytes Relative: 26.3 % (ref 12.0–46.0)
Lymphs Abs: 2 10*3/uL (ref 0.7–4.0)
MCHC: 32.7 g/dL (ref 30.0–36.0)
MCV: 86.8 fl (ref 78.0–100.0)
Monocytes Absolute: 0.5 10*3/uL (ref 0.1–1.0)
Monocytes Relative: 6.3 % (ref 3.0–12.0)
Neutro Abs: 4.7 10*3/uL (ref 1.4–7.7)
Neutrophils Relative %: 63.2 % (ref 43.0–77.0)
Platelets: 353 10*3/uL (ref 150.0–400.0)
RBC: 3.93 Mil/uL (ref 3.87–5.11)
RDW: 15.8 % — ABNORMAL HIGH (ref 11.5–15.5)
WBC: 7.4 10*3/uL (ref 4.0–10.5)

## 2018-11-13 LAB — IBC + FERRITIN
Ferritin: 3.6 ng/mL — ABNORMAL LOW (ref 10.0–291.0)
Iron: 29 ug/dL — ABNORMAL LOW (ref 42–145)
Saturation Ratios: 4.7 % — ABNORMAL LOW (ref 20.0–50.0)
Transferrin: 445 mg/dL — ABNORMAL HIGH (ref 212.0–360.0)

## 2018-11-13 LAB — TSH: TSH: 2.42 u[IU]/mL (ref 0.35–4.50)

## 2018-11-15 MED ORDER — IRON 325 (65 FE) MG PO TABS: 1.0000 | ORAL_TABLET | Freq: Every day | ORAL | 5 refills | Status: DC

## 2018-11-15 NOTE — Addendum Note (Signed)
Addended by: Philis Nettle on: 11/15/2018 12:28 PM   Modules accepted: Orders

## 2019-01-13 ENCOUNTER — Other Ambulatory Visit: Payer: Self-pay | Admitting: Family

## 2019-01-13 DIAGNOSIS — F419 Anxiety disorder, unspecified: Secondary | ICD-10-CM

## 2019-01-13 DIAGNOSIS — G4700 Insomnia, unspecified: Secondary | ICD-10-CM

## 2019-01-13 NOTE — Telephone Encounter (Signed)
Requested medication (s) are due for refill today: yes  Requested medication (s) are on the active medication list: yes  Last refill:  09/13/2018  Future visit scheduled: yes  Notes to clinic:  Last filled by Philis Nettle Review for refill   Requested Prescriptions  Pending Prescriptions Disp Refills   sertraline (ZOLOFT) 25 MG tablet 60 tablet 2    Sig: Take 2 tablets (50 mg total) by mouth at bedtime.      There is no refill protocol information for this order

## 2019-01-13 NOTE — Telephone Encounter (Signed)
Medication Refill - Medication: levothyroxine (SYNTHROID) 50 MCG tablet/sertraline (ZOLOFT) 25 MG tablet  Has the patient contacted their pharmacy? Yes.   (Agent: If no, request that the patient contact the pharmacy for the refill.) (Agent: If yes, when and what did the pharmacy advise?)  Preferred Pharmacy (with phone number or street name): [place], [address] Phone:  [phone]  Fax:  [phone]       Agent: Please be advised that RX refills may take up to 3 business days. We ask that you follow-up with your pharmacy.

## 2019-01-14 MED ORDER — SERTRALINE HCL 25 MG PO TABS: 50.0000 mg | ORAL_TABLET | Freq: Every day | ORAL | 0 refills | Status: DC

## 2019-01-15 ENCOUNTER — Ambulatory Visit (INDEPENDENT_AMBULATORY_CARE_PROVIDER_SITE_OTHER): Payer: 59 | Admitting: Family

## 2019-01-15 ENCOUNTER — Encounter: Payer: Self-pay | Admitting: Family

## 2019-01-15 ENCOUNTER — Other Ambulatory Visit: Payer: Self-pay

## 2019-01-15 ENCOUNTER — Telehealth: Payer: Self-pay | Admitting: Family

## 2019-01-15 VITALS — Ht 67.0 in | Wt 173.0 lb

## 2019-01-15 DIAGNOSIS — D509 Iron deficiency anemia, unspecified: Secondary | ICD-10-CM | POA: Diagnosis not present

## 2019-01-15 DIAGNOSIS — B001 Herpesviral vesicular dermatitis: Secondary | ICD-10-CM | POA: Diagnosis not present

## 2019-01-15 DIAGNOSIS — E039 Hypothyroidism, unspecified: Secondary | ICD-10-CM | POA: Diagnosis not present

## 2019-01-15 DIAGNOSIS — F419 Anxiety disorder, unspecified: Secondary | ICD-10-CM

## 2019-01-15 MED ORDER — VALACYCLOVIR HCL 1 G PO TABS: ORAL_TABLET | ORAL | 2 refills | Status: DC

## 2019-01-15 MED ORDER — LEVOTHYROXINE SODIUM 50 MCG PO TABS: 50.0000 ug | ORAL_TABLET | Freq: Every day | ORAL | 0 refills | Status: DC

## 2019-01-15 NOTE — Progress Notes (Signed)
Virtual Visit via Video Note  I connected with@  on 01/15/19 at 11:00 AM EST by a video enabled telemedicine application and verified that I am speaking with the correct person using two identifiers.  Location patient: home Location provider:home office Persons participating in the virtual visit: patient, provider  I discussed the limitations of evaluation and management by telemedicine and the availability of in person appointments. The patient expressed understanding and agreed to proceed.  Interactive audio and video telecommunications were attempted between this provider and patient, however failed, due to patient having technical difficulties or patient did not have access to video capability.  We continued and completed visit with audio only.    HPI: To establish care, transferring care   Feels 'much much better' . Energy has improved.  Anxiety has improved.  Some days does feel 'anxiety creeps back in.'  No SI.   Hypothyroidism- compliant with medication.euthyroid 11/13/18.   Iron deficient Anemia-takes vitamin with iron in it.   She also notes she has a cold sore on her lip today.  She thinks this is from stress  ROS: See pertinent positives and negatives per HPI.  Past Medical History:  Diagnosis Date  . Anxiety   . CIN 3 - cervical intraepithelial neoplasia grade 3    s/p LEEP  . HPV (human papilloma virus) infection   . Urinary tract infection     Past Surgical History:  Procedure Laterality Date  . VAGINAL DELIVERY     2    Family History  Problem Relation Age of Onset  . Stroke Paternal Grandfather   . Breast cancer Neg Hx   . Thyroid disease Neg Hx     SOCIAL HX: former smoker   Current Outpatient Medications:  .  levothyroxine (SYNTHROID) 50 MCG tablet, Take 1 tablet (50 mcg total) by mouth daily., Disp: 90 tablet, Rfl: 0 .  sertraline (ZOLOFT) 25 MG tablet, Take 2 tablets (50 mg total) by mouth at bedtime., Disp: 60 tablet, Rfl: 0 .  valACYclovir  (VALTREX) 1000 MG tablet, Take two tablets ( total 2000 mg) by mouth q12h x 1 day; Start: ASAP after symptom onset, Disp: 15 tablet, Rfl: 2  EXAM:  VITALS per patient if applicable:  GENERAL: alert, oriented, appears well and in no acute distress  HEENT: atraumatic, conjunttiva clear, no obvious abnormalities on inspection of external nose and ears  NECK: normal movements of the head and neck  LUNGS: on inspection no signs of respiratory distress, breathing rate appears normal, no obvious gross SOB, gasping or wheezing  CV: no obvious cyanosis  MS: moves all visible extremities without noticeable abnormality  PSYCH/NEURO: pleasant and cooperative, no obvious depression or anxiety, speech and thought processing grossly intact  ASSESSMENT AND PLAN:  Discussed the following assessment and plan:  Problem List Items Addressed This Visit      Digestive   Herpes labialis    Sent in a prescription for Valtrex to be taken as needed      Relevant Medications   valACYclovir (VALTREX) 1000 MG tablet     Endocrine   Acquired hypothyroidism - Primary    Symptoms appear improved .euthyroid, will check TSH in the next several weeks.      Relevant Medications   levothyroxine (SYNTHROID) 50 MCG tablet   Other Relevant Orders   TSH-future     Other   Anemia    Suspect improved. Recheck CBC, iron      Relevant Orders   IBC + Ferritin +  TIBC   CBC with Differential/Platelet   Anxiety    Some breakthrough anxiety, patient will self titrate Zoloft at home.  She will increase from 50 mg to 75mg  qhs and if needed to 100 mg qhs.  She will let me know if how she is feeling         -we discussed possible serious and likely etiologies, options for evaluation and workup, limitations of telemedicine visit vs in person visit, treatment, treatment risks and precautions. Pt prefers to treat via telemedicine empirically rather then risking or undertaking an in person visit at this moment.  Patient agrees to seek prompt in person care if worsening, new symptoms arise, or if is not improving with treatment.   I discussed the assessment and treatment plan with the patient. The patient was provided an opportunity to ask questions and all were answered. The patient agreed with the plan and demonstrated an understanding of the instructions.   The patient was advised to call back or seek an in-person evaluation if the symptoms worsen or if the condition fails to improve as anticipated.   Mable Paris, FNP

## 2019-01-15 NOTE — Assessment & Plan Note (Signed)
Some breakthrough anxiety, patient will self titrate Zoloft at home.  She will increase from 50 mg to 75mg  qhs and if needed to 100 mg qhs.  She will let me know if how she is feeling

## 2019-01-15 NOTE — Patient Instructions (Addendum)
Call [facility] and schedule diagnostic ultrasound and mammogram for Feb 2020.    may self increase Zoloft.  May increase from 50 mg to 75 mg at bedtime.  You may also increase to 100 mg at bedtime if needed.  Please let me know how you are doing  Stay safe!

## 2019-01-15 NOTE — Assessment & Plan Note (Signed)
Sent in a prescription for Valtrex to be taken as needed

## 2019-01-15 NOTE — Assessment & Plan Note (Addendum)
Symptoms appear improved .euthyroid, will check TSH in the next several weeks.

## 2019-01-15 NOTE — Assessment & Plan Note (Signed)
Suspect improved. Recheck CBC, iron

## 2019-01-15 NOTE — Telephone Encounter (Signed)
Lm on vm to call office to set up non-fasting labs for this month or next month, also 25m follow up.

## 2019-02-02 ENCOUNTER — Encounter: Payer: Self-pay | Admitting: Family

## 2019-02-02 ENCOUNTER — Other Ambulatory Visit: Payer: Self-pay | Admitting: Family Medicine

## 2019-02-02 DIAGNOSIS — F419 Anxiety disorder, unspecified: Secondary | ICD-10-CM

## 2019-02-02 DIAGNOSIS — G4700 Insomnia, unspecified: Secondary | ICD-10-CM

## 2019-02-03 ENCOUNTER — Other Ambulatory Visit: Payer: Self-pay

## 2019-02-03 MED ORDER — SERTRALINE HCL 100 MG PO TABS: 100.0000 mg | ORAL_TABLET | Freq: Every day | ORAL | 3 refills | Status: DC

## 2019-04-10 ENCOUNTER — Other Ambulatory Visit: Payer: Self-pay | Admitting: Family

## 2019-04-10 DIAGNOSIS — E039 Hypothyroidism, unspecified: Secondary | ICD-10-CM

## 2019-04-11 ENCOUNTER — Other Ambulatory Visit: Payer: Self-pay | Admitting: Family

## 2019-04-11 DIAGNOSIS — R928 Other abnormal and inconclusive findings on diagnostic imaging of breast: Secondary | ICD-10-CM

## 2019-04-14 ENCOUNTER — Other Ambulatory Visit (INDEPENDENT_AMBULATORY_CARE_PROVIDER_SITE_OTHER): Payer: 59

## 2019-04-14 ENCOUNTER — Other Ambulatory Visit: Payer: Self-pay

## 2019-04-14 DIAGNOSIS — E039 Hypothyroidism, unspecified: Secondary | ICD-10-CM | POA: Diagnosis not present

## 2019-04-14 DIAGNOSIS — D509 Iron deficiency anemia, unspecified: Secondary | ICD-10-CM | POA: Diagnosis not present

## 2019-04-15 ENCOUNTER — Encounter: Payer: Self-pay | Admitting: Family

## 2019-04-15 ENCOUNTER — Other Ambulatory Visit: Payer: Self-pay | Admitting: Family

## 2019-04-15 DIAGNOSIS — D649 Anemia, unspecified: Secondary | ICD-10-CM

## 2019-04-15 LAB — CBC WITH DIFFERENTIAL/PLATELET
Basophils Absolute: 0 10*3/uL (ref 0.0–0.1)
Basophils Relative: 0.4 % (ref 0.0–3.0)
Eosinophils Absolute: 0.1 10*3/uL (ref 0.0–0.7)
Eosinophils Relative: 1.8 % (ref 0.0–5.0)
HCT: 31.5 % — ABNORMAL LOW (ref 36.0–46.0)
Hemoglobin: 10.3 g/dL — ABNORMAL LOW (ref 12.0–15.0)
Lymphocytes Relative: 24.1 % (ref 12.0–46.0)
Lymphs Abs: 1.7 10*3/uL (ref 0.7–4.0)
MCHC: 32.6 g/dL (ref 30.0–36.0)
MCV: 81.7 fl (ref 78.0–100.0)
Monocytes Absolute: 0.5 10*3/uL (ref 0.1–1.0)
Monocytes Relative: 6.5 % (ref 3.0–12.0)
Neutro Abs: 4.8 10*3/uL (ref 1.4–7.7)
Neutrophils Relative %: 67.2 % (ref 43.0–77.0)
Platelets: 250 10*3/uL (ref 150.0–400.0)
RBC: 3.86 Mil/uL — ABNORMAL LOW (ref 3.87–5.11)
RDW: 16.9 % — ABNORMAL HIGH (ref 11.5–15.5)
WBC: 7.1 10*3/uL (ref 4.0–10.5)

## 2019-04-15 LAB — IBC + FERRITIN
Ferritin: 2.8 ng/mL — ABNORMAL LOW (ref 10.0–291.0)
Iron: 20 ug/dL — ABNORMAL LOW (ref 42–145)
Saturation Ratios: 3.5 % — ABNORMAL LOW (ref 20.0–50.0)
Transferrin: 410 mg/dL — ABNORMAL HIGH (ref 212.0–360.0)

## 2019-04-15 LAB — TSH: TSH: 2.2 u[IU]/mL (ref 0.35–4.50)

## 2019-06-20 ENCOUNTER — Ambulatory Visit
Admission: RE | Admit: 2019-06-20 | Discharge: 2019-06-20 | Disposition: A | Discharge status: Home / Self Care | Payer: 59 | Source: Ambulatory Visit | Attending: Family | Admitting: Family

## 2019-06-20 ENCOUNTER — Telehealth: Payer: Self-pay

## 2019-06-20 ENCOUNTER — Other Ambulatory Visit: Payer: Self-pay | Admitting: Family

## 2019-06-20 DIAGNOSIS — R928 Other abnormal and inconclusive findings on diagnostic imaging of breast: Secondary | ICD-10-CM

## 2019-06-20 DIAGNOSIS — N6489 Other specified disorders of breast: Secondary | ICD-10-CM | POA: Diagnosis not present

## 2019-06-20 DIAGNOSIS — N6320 Unspecified lump in the left breast, unspecified quadrant: Secondary | ICD-10-CM

## 2019-06-20 DIAGNOSIS — N6323 Unspecified lump in the left breast, lower outer quadrant: Secondary | ICD-10-CM | POA: Diagnosis not present

## 2019-06-20 DIAGNOSIS — N6321 Unspecified lump in the left breast, upper outer quadrant: Secondary | ICD-10-CM | POA: Diagnosis not present

## 2019-06-20 DIAGNOSIS — R922 Inconclusive mammogram: Secondary | ICD-10-CM | POA: Diagnosis not present

## 2019-06-20 NOTE — Telephone Encounter (Signed)
LMTCB for mammogram results or respond via mychart.

## 2019-06-26 ENCOUNTER — Encounter: Payer: Self-pay | Admitting: Family

## 2019-07-09 ENCOUNTER — Other Ambulatory Visit: Payer: Self-pay | Admitting: Family

## 2019-08-25 DIAGNOSIS — M5117 Intervertebral disc disorders with radiculopathy, lumbosacral region: Secondary | ICD-10-CM | POA: Diagnosis not present

## 2019-08-25 DIAGNOSIS — M9903 Segmental and somatic dysfunction of lumbar region: Secondary | ICD-10-CM | POA: Diagnosis not present

## 2019-08-25 DIAGNOSIS — M955 Acquired deformity of pelvis: Secondary | ICD-10-CM | POA: Diagnosis not present

## 2019-08-25 DIAGNOSIS — M546 Pain in thoracic spine: Secondary | ICD-10-CM | POA: Diagnosis not present

## 2019-08-25 DIAGNOSIS — M5116 Intervertebral disc disorders with radiculopathy, lumbar region: Secondary | ICD-10-CM | POA: Diagnosis not present

## 2019-08-25 DIAGNOSIS — M6080 Other myositis, unspecified site: Secondary | ICD-10-CM | POA: Diagnosis not present

## 2019-08-25 DIAGNOSIS — M9905 Segmental and somatic dysfunction of pelvic region: Secondary | ICD-10-CM | POA: Diagnosis not present

## 2019-08-25 DIAGNOSIS — M62830 Muscle spasm of back: Secondary | ICD-10-CM | POA: Diagnosis not present

## 2019-08-25 DIAGNOSIS — M9902 Segmental and somatic dysfunction of thoracic region: Secondary | ICD-10-CM | POA: Diagnosis not present

## 2019-08-26 DIAGNOSIS — M6080 Other myositis, unspecified site: Secondary | ICD-10-CM | POA: Diagnosis not present

## 2019-08-26 DIAGNOSIS — M5116 Intervertebral disc disorders with radiculopathy, lumbar region: Secondary | ICD-10-CM | POA: Diagnosis not present

## 2019-08-26 DIAGNOSIS — M9905 Segmental and somatic dysfunction of pelvic region: Secondary | ICD-10-CM | POA: Diagnosis not present

## 2019-08-26 DIAGNOSIS — M9903 Segmental and somatic dysfunction of lumbar region: Secondary | ICD-10-CM | POA: Diagnosis not present

## 2019-08-26 DIAGNOSIS — M5117 Intervertebral disc disorders with radiculopathy, lumbosacral region: Secondary | ICD-10-CM | POA: Diagnosis not present

## 2019-08-26 DIAGNOSIS — M955 Acquired deformity of pelvis: Secondary | ICD-10-CM | POA: Diagnosis not present

## 2019-08-26 DIAGNOSIS — M62830 Muscle spasm of back: Secondary | ICD-10-CM | POA: Diagnosis not present

## 2019-08-26 DIAGNOSIS — M546 Pain in thoracic spine: Secondary | ICD-10-CM | POA: Diagnosis not present

## 2019-08-26 DIAGNOSIS — M9902 Segmental and somatic dysfunction of thoracic region: Secondary | ICD-10-CM | POA: Diagnosis not present

## 2019-09-01 DIAGNOSIS — M546 Pain in thoracic spine: Secondary | ICD-10-CM | POA: Diagnosis not present

## 2019-09-01 DIAGNOSIS — M6080 Other myositis, unspecified site: Secondary | ICD-10-CM | POA: Diagnosis not present

## 2019-09-01 DIAGNOSIS — M5117 Intervertebral disc disorders with radiculopathy, lumbosacral region: Secondary | ICD-10-CM | POA: Diagnosis not present

## 2019-09-01 DIAGNOSIS — M9903 Segmental and somatic dysfunction of lumbar region: Secondary | ICD-10-CM | POA: Diagnosis not present

## 2019-09-01 DIAGNOSIS — M5116 Intervertebral disc disorders with radiculopathy, lumbar region: Secondary | ICD-10-CM | POA: Diagnosis not present

## 2019-09-01 DIAGNOSIS — M62830 Muscle spasm of back: Secondary | ICD-10-CM | POA: Diagnosis not present

## 2019-09-01 DIAGNOSIS — M955 Acquired deformity of pelvis: Secondary | ICD-10-CM | POA: Diagnosis not present

## 2019-09-01 DIAGNOSIS — M9902 Segmental and somatic dysfunction of thoracic region: Secondary | ICD-10-CM | POA: Diagnosis not present

## 2019-09-01 DIAGNOSIS — M9905 Segmental and somatic dysfunction of pelvic region: Secondary | ICD-10-CM | POA: Diagnosis not present

## 2019-09-03 DIAGNOSIS — M5116 Intervertebral disc disorders with radiculopathy, lumbar region: Secondary | ICD-10-CM | POA: Diagnosis not present

## 2019-09-03 DIAGNOSIS — M6080 Other myositis, unspecified site: Secondary | ICD-10-CM | POA: Diagnosis not present

## 2019-09-03 DIAGNOSIS — M955 Acquired deformity of pelvis: Secondary | ICD-10-CM | POA: Diagnosis not present

## 2019-09-03 DIAGNOSIS — M5117 Intervertebral disc disorders with radiculopathy, lumbosacral region: Secondary | ICD-10-CM | POA: Diagnosis not present

## 2019-09-03 DIAGNOSIS — M9905 Segmental and somatic dysfunction of pelvic region: Secondary | ICD-10-CM | POA: Diagnosis not present

## 2019-09-03 DIAGNOSIS — M62830 Muscle spasm of back: Secondary | ICD-10-CM | POA: Diagnosis not present

## 2019-09-03 DIAGNOSIS — M9903 Segmental and somatic dysfunction of lumbar region: Secondary | ICD-10-CM | POA: Diagnosis not present

## 2019-09-03 DIAGNOSIS — M9902 Segmental and somatic dysfunction of thoracic region: Secondary | ICD-10-CM | POA: Diagnosis not present

## 2019-09-03 DIAGNOSIS — M546 Pain in thoracic spine: Secondary | ICD-10-CM | POA: Diagnosis not present

## 2019-09-11 DIAGNOSIS — M5116 Intervertebral disc disorders with radiculopathy, lumbar region: Secondary | ICD-10-CM | POA: Diagnosis not present

## 2019-09-11 DIAGNOSIS — M6080 Other myositis, unspecified site: Secondary | ICD-10-CM | POA: Diagnosis not present

## 2019-09-11 DIAGNOSIS — M5117 Intervertebral disc disorders with radiculopathy, lumbosacral region: Secondary | ICD-10-CM | POA: Diagnosis not present

## 2019-09-11 DIAGNOSIS — M62830 Muscle spasm of back: Secondary | ICD-10-CM | POA: Diagnosis not present

## 2019-09-11 DIAGNOSIS — M9902 Segmental and somatic dysfunction of thoracic region: Secondary | ICD-10-CM | POA: Diagnosis not present

## 2019-09-11 DIAGNOSIS — M9905 Segmental and somatic dysfunction of pelvic region: Secondary | ICD-10-CM | POA: Diagnosis not present

## 2019-09-11 DIAGNOSIS — M546 Pain in thoracic spine: Secondary | ICD-10-CM | POA: Diagnosis not present

## 2019-09-11 DIAGNOSIS — M9903 Segmental and somatic dysfunction of lumbar region: Secondary | ICD-10-CM | POA: Diagnosis not present

## 2019-09-11 DIAGNOSIS — M955 Acquired deformity of pelvis: Secondary | ICD-10-CM | POA: Diagnosis not present

## 2019-09-15 DIAGNOSIS — M5117 Intervertebral disc disorders with radiculopathy, lumbosacral region: Secondary | ICD-10-CM | POA: Diagnosis not present

## 2019-09-15 DIAGNOSIS — M5116 Intervertebral disc disorders with radiculopathy, lumbar region: Secondary | ICD-10-CM | POA: Diagnosis not present

## 2019-09-15 DIAGNOSIS — M9903 Segmental and somatic dysfunction of lumbar region: Secondary | ICD-10-CM | POA: Diagnosis not present

## 2019-09-15 DIAGNOSIS — M62830 Muscle spasm of back: Secondary | ICD-10-CM | POA: Diagnosis not present

## 2019-09-15 DIAGNOSIS — M6080 Other myositis, unspecified site: Secondary | ICD-10-CM | POA: Diagnosis not present

## 2019-09-15 DIAGNOSIS — M546 Pain in thoracic spine: Secondary | ICD-10-CM | POA: Diagnosis not present

## 2019-09-15 DIAGNOSIS — M9905 Segmental and somatic dysfunction of pelvic region: Secondary | ICD-10-CM | POA: Diagnosis not present

## 2019-09-15 DIAGNOSIS — M955 Acquired deformity of pelvis: Secondary | ICD-10-CM | POA: Diagnosis not present

## 2019-09-15 DIAGNOSIS — M9902 Segmental and somatic dysfunction of thoracic region: Secondary | ICD-10-CM | POA: Diagnosis not present

## 2019-09-22 DIAGNOSIS — M62830 Muscle spasm of back: Secondary | ICD-10-CM | POA: Diagnosis not present

## 2019-09-22 DIAGNOSIS — M955 Acquired deformity of pelvis: Secondary | ICD-10-CM | POA: Diagnosis not present

## 2019-09-22 DIAGNOSIS — M5117 Intervertebral disc disorders with radiculopathy, lumbosacral region: Secondary | ICD-10-CM | POA: Diagnosis not present

## 2019-09-22 DIAGNOSIS — M546 Pain in thoracic spine: Secondary | ICD-10-CM | POA: Diagnosis not present

## 2019-09-22 DIAGNOSIS — M5116 Intervertebral disc disorders with radiculopathy, lumbar region: Secondary | ICD-10-CM | POA: Diagnosis not present

## 2019-09-22 DIAGNOSIS — M9905 Segmental and somatic dysfunction of pelvic region: Secondary | ICD-10-CM | POA: Diagnosis not present

## 2019-09-22 DIAGNOSIS — M6080 Other myositis, unspecified site: Secondary | ICD-10-CM | POA: Diagnosis not present

## 2019-09-22 DIAGNOSIS — M9903 Segmental and somatic dysfunction of lumbar region: Secondary | ICD-10-CM | POA: Diagnosis not present

## 2019-09-22 DIAGNOSIS — M9902 Segmental and somatic dysfunction of thoracic region: Secondary | ICD-10-CM | POA: Diagnosis not present

## 2019-09-25 DIAGNOSIS — M9903 Segmental and somatic dysfunction of lumbar region: Secondary | ICD-10-CM | POA: Diagnosis not present

## 2019-09-25 DIAGNOSIS — M5116 Intervertebral disc disorders with radiculopathy, lumbar region: Secondary | ICD-10-CM | POA: Diagnosis not present

## 2019-09-25 DIAGNOSIS — M62830 Muscle spasm of back: Secondary | ICD-10-CM | POA: Diagnosis not present

## 2019-09-25 DIAGNOSIS — M546 Pain in thoracic spine: Secondary | ICD-10-CM | POA: Diagnosis not present

## 2019-09-25 DIAGNOSIS — M9905 Segmental and somatic dysfunction of pelvic region: Secondary | ICD-10-CM | POA: Diagnosis not present

## 2019-09-25 DIAGNOSIS — M9902 Segmental and somatic dysfunction of thoracic region: Secondary | ICD-10-CM | POA: Diagnosis not present

## 2019-09-25 DIAGNOSIS — M6080 Other myositis, unspecified site: Secondary | ICD-10-CM | POA: Diagnosis not present

## 2019-09-25 DIAGNOSIS — M5117 Intervertebral disc disorders with radiculopathy, lumbosacral region: Secondary | ICD-10-CM | POA: Diagnosis not present

## 2019-09-25 DIAGNOSIS — M955 Acquired deformity of pelvis: Secondary | ICD-10-CM | POA: Diagnosis not present

## 2019-09-29 DIAGNOSIS — M9905 Segmental and somatic dysfunction of pelvic region: Secondary | ICD-10-CM | POA: Diagnosis not present

## 2019-09-29 DIAGNOSIS — M62830 Muscle spasm of back: Secondary | ICD-10-CM | POA: Diagnosis not present

## 2019-09-29 DIAGNOSIS — M5116 Intervertebral disc disorders with radiculopathy, lumbar region: Secondary | ICD-10-CM | POA: Diagnosis not present

## 2019-09-29 DIAGNOSIS — M9903 Segmental and somatic dysfunction of lumbar region: Secondary | ICD-10-CM | POA: Diagnosis not present

## 2019-09-29 DIAGNOSIS — M955 Acquired deformity of pelvis: Secondary | ICD-10-CM | POA: Diagnosis not present

## 2019-09-29 DIAGNOSIS — M6080 Other myositis, unspecified site: Secondary | ICD-10-CM | POA: Diagnosis not present

## 2019-09-29 DIAGNOSIS — M546 Pain in thoracic spine: Secondary | ICD-10-CM | POA: Diagnosis not present

## 2019-09-29 DIAGNOSIS — M9902 Segmental and somatic dysfunction of thoracic region: Secondary | ICD-10-CM | POA: Diagnosis not present

## 2019-09-29 DIAGNOSIS — M5117 Intervertebral disc disorders with radiculopathy, lumbosacral region: Secondary | ICD-10-CM | POA: Diagnosis not present

## 2019-09-30 ENCOUNTER — Other Ambulatory Visit: Payer: Self-pay | Admitting: Family

## 2019-09-30 DIAGNOSIS — E039 Hypothyroidism, unspecified: Secondary | ICD-10-CM

## 2019-10-01 DIAGNOSIS — M9903 Segmental and somatic dysfunction of lumbar region: Secondary | ICD-10-CM | POA: Diagnosis not present

## 2019-10-01 DIAGNOSIS — M5116 Intervertebral disc disorders with radiculopathy, lumbar region: Secondary | ICD-10-CM | POA: Diagnosis not present

## 2019-10-01 DIAGNOSIS — M6080 Other myositis, unspecified site: Secondary | ICD-10-CM | POA: Diagnosis not present

## 2019-10-01 DIAGNOSIS — M955 Acquired deformity of pelvis: Secondary | ICD-10-CM | POA: Diagnosis not present

## 2019-10-01 DIAGNOSIS — M9902 Segmental and somatic dysfunction of thoracic region: Secondary | ICD-10-CM | POA: Diagnosis not present

## 2019-10-01 DIAGNOSIS — M9905 Segmental and somatic dysfunction of pelvic region: Secondary | ICD-10-CM | POA: Diagnosis not present

## 2019-10-01 DIAGNOSIS — M546 Pain in thoracic spine: Secondary | ICD-10-CM | POA: Diagnosis not present

## 2019-10-01 DIAGNOSIS — M5117 Intervertebral disc disorders with radiculopathy, lumbosacral region: Secondary | ICD-10-CM | POA: Diagnosis not present

## 2019-10-01 DIAGNOSIS — M62830 Muscle spasm of back: Secondary | ICD-10-CM | POA: Diagnosis not present

## 2019-10-07 DIAGNOSIS — M9902 Segmental and somatic dysfunction of thoracic region: Secondary | ICD-10-CM | POA: Diagnosis not present

## 2019-10-07 DIAGNOSIS — M955 Acquired deformity of pelvis: Secondary | ICD-10-CM | POA: Diagnosis not present

## 2019-10-07 DIAGNOSIS — M5116 Intervertebral disc disorders with radiculopathy, lumbar region: Secondary | ICD-10-CM | POA: Diagnosis not present

## 2019-10-07 DIAGNOSIS — M6080 Other myositis, unspecified site: Secondary | ICD-10-CM | POA: Diagnosis not present

## 2019-10-07 DIAGNOSIS — M5117 Intervertebral disc disorders with radiculopathy, lumbosacral region: Secondary | ICD-10-CM | POA: Diagnosis not present

## 2019-10-07 DIAGNOSIS — M9903 Segmental and somatic dysfunction of lumbar region: Secondary | ICD-10-CM | POA: Diagnosis not present

## 2019-10-07 DIAGNOSIS — M9905 Segmental and somatic dysfunction of pelvic region: Secondary | ICD-10-CM | POA: Diagnosis not present

## 2019-10-07 DIAGNOSIS — M546 Pain in thoracic spine: Secondary | ICD-10-CM | POA: Diagnosis not present

## 2019-10-07 DIAGNOSIS — M62830 Muscle spasm of back: Secondary | ICD-10-CM | POA: Diagnosis not present

## 2019-10-15 DIAGNOSIS — M955 Acquired deformity of pelvis: Secondary | ICD-10-CM | POA: Diagnosis not present

## 2019-10-15 DIAGNOSIS — M9905 Segmental and somatic dysfunction of pelvic region: Secondary | ICD-10-CM | POA: Diagnosis not present

## 2019-10-15 DIAGNOSIS — M62830 Muscle spasm of back: Secondary | ICD-10-CM | POA: Diagnosis not present

## 2019-10-15 DIAGNOSIS — M6080 Other myositis, unspecified site: Secondary | ICD-10-CM | POA: Diagnosis not present

## 2019-10-15 DIAGNOSIS — M546 Pain in thoracic spine: Secondary | ICD-10-CM | POA: Diagnosis not present

## 2019-10-15 DIAGNOSIS — M9902 Segmental and somatic dysfunction of thoracic region: Secondary | ICD-10-CM | POA: Diagnosis not present

## 2019-10-15 DIAGNOSIS — M9903 Segmental and somatic dysfunction of lumbar region: Secondary | ICD-10-CM | POA: Diagnosis not present

## 2019-10-15 DIAGNOSIS — M5116 Intervertebral disc disorders with radiculopathy, lumbar region: Secondary | ICD-10-CM | POA: Diagnosis not present

## 2019-10-15 DIAGNOSIS — M5117 Intervertebral disc disorders with radiculopathy, lumbosacral region: Secondary | ICD-10-CM | POA: Diagnosis not present

## 2019-10-23 DIAGNOSIS — M62830 Muscle spasm of back: Secondary | ICD-10-CM | POA: Diagnosis not present

## 2019-10-23 DIAGNOSIS — M9905 Segmental and somatic dysfunction of pelvic region: Secondary | ICD-10-CM | POA: Diagnosis not present

## 2019-10-23 DIAGNOSIS — M6080 Other myositis, unspecified site: Secondary | ICD-10-CM | POA: Diagnosis not present

## 2019-10-23 DIAGNOSIS — M5117 Intervertebral disc disorders with radiculopathy, lumbosacral region: Secondary | ICD-10-CM | POA: Diagnosis not present

## 2019-10-23 DIAGNOSIS — M546 Pain in thoracic spine: Secondary | ICD-10-CM | POA: Diagnosis not present

## 2019-10-23 DIAGNOSIS — M5116 Intervertebral disc disorders with radiculopathy, lumbar region: Secondary | ICD-10-CM | POA: Diagnosis not present

## 2019-10-23 DIAGNOSIS — M9902 Segmental and somatic dysfunction of thoracic region: Secondary | ICD-10-CM | POA: Diagnosis not present

## 2019-10-23 DIAGNOSIS — M955 Acquired deformity of pelvis: Secondary | ICD-10-CM | POA: Diagnosis not present

## 2019-10-23 DIAGNOSIS — M9903 Segmental and somatic dysfunction of lumbar region: Secondary | ICD-10-CM | POA: Diagnosis not present

## 2019-11-05 DIAGNOSIS — M5117 Intervertebral disc disorders with radiculopathy, lumbosacral region: Secondary | ICD-10-CM | POA: Diagnosis not present

## 2019-11-05 DIAGNOSIS — M9902 Segmental and somatic dysfunction of thoracic region: Secondary | ICD-10-CM | POA: Diagnosis not present

## 2019-11-05 DIAGNOSIS — M9905 Segmental and somatic dysfunction of pelvic region: Secondary | ICD-10-CM | POA: Diagnosis not present

## 2019-11-05 DIAGNOSIS — M9903 Segmental and somatic dysfunction of lumbar region: Secondary | ICD-10-CM | POA: Diagnosis not present

## 2019-11-05 DIAGNOSIS — M6080 Other myositis, unspecified site: Secondary | ICD-10-CM | POA: Diagnosis not present

## 2019-11-05 DIAGNOSIS — M62830 Muscle spasm of back: Secondary | ICD-10-CM | POA: Diagnosis not present

## 2019-11-05 DIAGNOSIS — M5116 Intervertebral disc disorders with radiculopathy, lumbar region: Secondary | ICD-10-CM | POA: Diagnosis not present

## 2019-11-05 DIAGNOSIS — M546 Pain in thoracic spine: Secondary | ICD-10-CM | POA: Diagnosis not present

## 2019-11-05 DIAGNOSIS — M955 Acquired deformity of pelvis: Secondary | ICD-10-CM | POA: Diagnosis not present

## 2019-11-18 DIAGNOSIS — M62830 Muscle spasm of back: Secondary | ICD-10-CM | POA: Diagnosis not present

## 2019-11-18 DIAGNOSIS — M9905 Segmental and somatic dysfunction of pelvic region: Secondary | ICD-10-CM | POA: Diagnosis not present

## 2019-11-18 DIAGNOSIS — M5116 Intervertebral disc disorders with radiculopathy, lumbar region: Secondary | ICD-10-CM | POA: Diagnosis not present

## 2019-11-18 DIAGNOSIS — M546 Pain in thoracic spine: Secondary | ICD-10-CM | POA: Diagnosis not present

## 2019-11-18 DIAGNOSIS — M5117 Intervertebral disc disorders with radiculopathy, lumbosacral region: Secondary | ICD-10-CM | POA: Diagnosis not present

## 2019-11-18 DIAGNOSIS — M9903 Segmental and somatic dysfunction of lumbar region: Secondary | ICD-10-CM | POA: Diagnosis not present

## 2019-11-18 DIAGNOSIS — M9902 Segmental and somatic dysfunction of thoracic region: Secondary | ICD-10-CM | POA: Diagnosis not present

## 2019-11-18 DIAGNOSIS — M6080 Other myositis, unspecified site: Secondary | ICD-10-CM | POA: Diagnosis not present

## 2019-11-18 DIAGNOSIS — M955 Acquired deformity of pelvis: Secondary | ICD-10-CM | POA: Diagnosis not present

## 2019-11-24 ENCOUNTER — Other Ambulatory Visit: Payer: Self-pay | Admitting: Family

## 2019-12-08 DIAGNOSIS — M9903 Segmental and somatic dysfunction of lumbar region: Secondary | ICD-10-CM | POA: Diagnosis not present

## 2019-12-08 DIAGNOSIS — M5117 Intervertebral disc disorders with radiculopathy, lumbosacral region: Secondary | ICD-10-CM | POA: Diagnosis not present

## 2019-12-08 DIAGNOSIS — M5116 Intervertebral disc disorders with radiculopathy, lumbar region: Secondary | ICD-10-CM | POA: Diagnosis not present

## 2019-12-08 DIAGNOSIS — M9902 Segmental and somatic dysfunction of thoracic region: Secondary | ICD-10-CM | POA: Diagnosis not present

## 2019-12-08 DIAGNOSIS — M6080 Other myositis, unspecified site: Secondary | ICD-10-CM | POA: Diagnosis not present

## 2019-12-08 DIAGNOSIS — M955 Acquired deformity of pelvis: Secondary | ICD-10-CM | POA: Diagnosis not present

## 2019-12-08 DIAGNOSIS — M546 Pain in thoracic spine: Secondary | ICD-10-CM | POA: Diagnosis not present

## 2019-12-08 DIAGNOSIS — M9905 Segmental and somatic dysfunction of pelvic region: Secondary | ICD-10-CM | POA: Diagnosis not present

## 2019-12-08 DIAGNOSIS — M62830 Muscle spasm of back: Secondary | ICD-10-CM | POA: Diagnosis not present

## 2020-01-12 DIAGNOSIS — M5117 Intervertebral disc disorders with radiculopathy, lumbosacral region: Secondary | ICD-10-CM | POA: Diagnosis not present

## 2020-01-12 DIAGNOSIS — M955 Acquired deformity of pelvis: Secondary | ICD-10-CM | POA: Diagnosis not present

## 2020-01-12 DIAGNOSIS — M5116 Intervertebral disc disorders with radiculopathy, lumbar region: Secondary | ICD-10-CM | POA: Diagnosis not present

## 2020-01-12 DIAGNOSIS — M9902 Segmental and somatic dysfunction of thoracic region: Secondary | ICD-10-CM | POA: Diagnosis not present

## 2020-01-12 DIAGNOSIS — M62830 Muscle spasm of back: Secondary | ICD-10-CM | POA: Diagnosis not present

## 2020-01-12 DIAGNOSIS — M9903 Segmental and somatic dysfunction of lumbar region: Secondary | ICD-10-CM | POA: Diagnosis not present

## 2020-01-12 DIAGNOSIS — M9905 Segmental and somatic dysfunction of pelvic region: Secondary | ICD-10-CM | POA: Diagnosis not present

## 2020-01-12 DIAGNOSIS — M546 Pain in thoracic spine: Secondary | ICD-10-CM | POA: Diagnosis not present

## 2020-01-12 DIAGNOSIS — M6080 Other myositis, unspecified site: Secondary | ICD-10-CM | POA: Diagnosis not present

## 2020-01-13 ENCOUNTER — Ambulatory Visit: Payer: 59 | Attending: Internal Medicine

## 2020-01-13 ENCOUNTER — Other Ambulatory Visit: Payer: Self-pay | Admitting: Internal Medicine

## 2020-01-13 DIAGNOSIS — Z23 Encounter for immunization: Secondary | ICD-10-CM

## 2020-01-13 NOTE — Progress Notes (Signed)
   Covid-19 Vaccination Clinic  Name:  Colleen Rodriguez    MRN: [id number] DOB: November 17, 1975  01/13/2020  [name] was observed post Covid-19 immunization for 15 minutes without incident. She was provided with Vaccine Information Sheet and instruction to access the V-Safe system.   [name] was instructed to call 911 with any severe reactions post vaccine: Marland Kitchen Difficulty breathing  . Swelling of face and throat  . A fast heartbeat  . A bad rash all over body  . Dizziness and weakness   Immunizations Administered    Name Date Dose VIS Date Route   Pfizer COVID-19 Vaccine 01/13/2020  2:14 PM 0.3 mL 11/19/2019 Intramuscular   Manufacturer: [employer], [employer]   Lot: [id number]   NDC: [address]-6

## 2020-02-11 DIAGNOSIS — M5117 Intervertebral disc disorders with radiculopathy, lumbosacral region: Secondary | ICD-10-CM | POA: Diagnosis not present

## 2020-02-11 DIAGNOSIS — M5116 Intervertebral disc disorders with radiculopathy, lumbar region: Secondary | ICD-10-CM | POA: Diagnosis not present

## 2020-02-11 DIAGNOSIS — M546 Pain in thoracic spine: Secondary | ICD-10-CM | POA: Diagnosis not present

## 2020-02-11 DIAGNOSIS — M62830 Muscle spasm of back: Secondary | ICD-10-CM | POA: Diagnosis not present

## 2020-02-11 DIAGNOSIS — M9903 Segmental and somatic dysfunction of lumbar region: Secondary | ICD-10-CM | POA: Diagnosis not present

## 2020-02-11 DIAGNOSIS — M6080 Other myositis, unspecified site: Secondary | ICD-10-CM | POA: Diagnosis not present

## 2020-02-11 DIAGNOSIS — M955 Acquired deformity of pelvis: Secondary | ICD-10-CM | POA: Diagnosis not present

## 2020-02-11 DIAGNOSIS — M9905 Segmental and somatic dysfunction of pelvic region: Secondary | ICD-10-CM | POA: Diagnosis not present

## 2020-02-11 DIAGNOSIS — M9902 Segmental and somatic dysfunction of thoracic region: Secondary | ICD-10-CM | POA: Diagnosis not present

## 2020-03-03 ENCOUNTER — Encounter: Payer: Self-pay | Admitting: Family

## 2020-03-03 NOTE — Telephone Encounter (Signed)
Are you ok with prescribing this or would you prefer a visit?

## 2020-03-15 ENCOUNTER — Other Ambulatory Visit: Payer: Self-pay | Admitting: Family

## 2020-03-15 DIAGNOSIS — E039 Hypothyroidism, unspecified: Secondary | ICD-10-CM

## 2020-03-15 DIAGNOSIS — B001 Herpesviral vesicular dermatitis: Secondary | ICD-10-CM

## 2020-03-15 MED ORDER — LEVOTHYROXINE SODIUM 50 MCG PO TABS: 50.0000 ug | ORAL_TABLET | Freq: Every day | ORAL | 0 refills | Status: DC

## 2020-03-26 ENCOUNTER — Encounter: Payer: Self-pay | Admitting: Family

## 2020-03-26 ENCOUNTER — Ambulatory Visit (INDEPENDENT_AMBULATORY_CARE_PROVIDER_SITE_OTHER): Payer: 59 | Admitting: Family

## 2020-03-26 ENCOUNTER — Other Ambulatory Visit: Payer: Self-pay

## 2020-03-26 ENCOUNTER — Other Ambulatory Visit: Payer: Self-pay | Admitting: Family

## 2020-03-26 VITALS — BP 102/68 | HR 69 | Temp 98.2°F | Ht 67.0 in | Wt 199.8 lb

## 2020-03-26 DIAGNOSIS — N920 Excessive and frequent menstruation with regular cycle: Secondary | ICD-10-CM | POA: Diagnosis not present

## 2020-03-26 DIAGNOSIS — Z0000 Encounter for general adult medical examination without abnormal findings: Secondary | ICD-10-CM

## 2020-03-26 DIAGNOSIS — F419 Anxiety disorder, unspecified: Secondary | ICD-10-CM | POA: Diagnosis not present

## 2020-03-26 DIAGNOSIS — Z23 Encounter for immunization: Secondary | ICD-10-CM | POA: Diagnosis not present

## 2020-03-26 DIAGNOSIS — E039 Hypothyroidism, unspecified: Secondary | ICD-10-CM

## 2020-03-26 LAB — CBC WITH DIFFERENTIAL/PLATELET
Basophils Absolute: 0 10*3/uL (ref 0.0–0.1)
Basophils Relative: 0.8 % (ref 0.0–3.0)
Eosinophils Absolute: 0.2 10*3/uL (ref 0.0–0.7)
Eosinophils Relative: 3.1 % (ref 0.0–5.0)
HCT: 34.7 % — ABNORMAL LOW (ref 36.0–46.0)
Hemoglobin: 11.7 g/dL — ABNORMAL LOW (ref 12.0–15.0)
Lymphocytes Relative: 27.7 % (ref 12.0–46.0)
Lymphs Abs: 1.8 10*3/uL (ref 0.7–4.0)
MCHC: 33.6 g/dL (ref 30.0–36.0)
MCV: 85.1 fl (ref 78.0–100.0)
Monocytes Absolute: 0.4 10*3/uL (ref 0.1–1.0)
Monocytes Relative: 6.4 % (ref 3.0–12.0)
Neutro Abs: 3.9 10*3/uL (ref 1.4–7.7)
Neutrophils Relative %: 62 % (ref 43.0–77.0)
Platelets: 295 10*3/uL (ref 150.0–400.0)
RBC: 4.08 Mil/uL (ref 3.87–5.11)
RDW: 15.4 % (ref 11.5–15.5)
WBC: 6.3 10*3/uL (ref 4.0–10.5)

## 2020-03-26 LAB — IBC + FERRITIN
Ferritin: 3.3 ng/mL — ABNORMAL LOW (ref 10.0–291.0)
Iron: 55 ug/dL (ref 42–145)
Saturation Ratios: 9.2 % — ABNORMAL LOW (ref 20.0–50.0)
Transferrin: 426 mg/dL — ABNORMAL HIGH (ref 212.0–360.0)

## 2020-03-26 LAB — COMPREHENSIVE METABOLIC PANEL
ALT: 13 U/L (ref 0–35)
AST: 13 U/L (ref 0–37)
Albumin: 4.2 g/dL (ref 3.5–5.2)
Alkaline Phosphatase: 69 U/L (ref 39–117)
BUN: 10 mg/dL (ref 6–23)
CO2: 28 mEq/L (ref 19–32)
Calcium: 9.6 mg/dL (ref 8.4–10.5)
Chloride: 103 mEq/L (ref 96–112)
Creatinine, Ser: 0.67 mg/dL (ref 0.40–1.20)
GFR: 106.61 mL/min (ref >60.00)
Glucose, Bld: 86 mg/dL (ref 70–99)
Potassium: 4.4 mEq/L (ref 3.5–5.1)
Sodium: 138 mEq/L (ref 135–145)
Total Bilirubin: 0.8 mg/dL (ref 0.2–1.2)
Total Protein: 6.9 g/dL (ref 6.0–8.3)

## 2020-03-26 LAB — TSH: TSH: 2.63 u[IU]/mL (ref 0.35–4.50)

## 2020-03-26 LAB — LIPID PANEL
Cholesterol: 197 mg/dL (ref 0–200)
HDL: 82.2 mg/dL (ref >39.00)
LDL Cholesterol: 96 mg/dL (ref 0–99)
NonHDL: 114.62
Total CHOL/HDL Ratio: 2
Triglycerides: 91 mg/dL (ref 0.0–149.0)
VLDL: 18.2 mg/dL (ref 0.0–40.0)

## 2020-03-26 LAB — VITAMIN D 25 HYDROXY (VIT D DEFICIENCY, FRACTURES): VITD: 26.26 ng/mL — ABNORMAL LOW (ref 30.00–100.00)

## 2020-03-26 LAB — HEMOGLOBIN A1C: Hgb A1c MFr Bld: 5.6 % (ref 4.6–6.5)

## 2020-03-26 MED ORDER — NORETHIN ACE-ETH ESTRAD-FE 1-20 MG-MCG PO TABS: 1.0000 | ORAL_TABLET | Freq: Every day | ORAL | 11 refills | Status: DC

## 2020-03-26 NOTE — Patient Instructions (Addendum)
Start birth control   Health Maintenance, Female Adopting a healthy lifestyle and getting preventive care are important in promoting health and wellness. Ask your health care provider about:  The right schedule for you to have regular tests and exams.  Things you can do on your own to prevent diseases and keep yourself healthy. What should I know about diet, weight, and exercise? Eat a healthy diet  Eat a diet that includes plenty of vegetables, fruits, low-fat dairy products, and lean protein.  Do not eat a lot of foods that are high in solid fats, added sugars, or sodium.   Maintain a healthy weight Body mass index (BMI) is used to identify weight problems. It estimates body fat based on height and weight. Your health care provider can help determine your BMI and help you achieve or maintain a healthy weight. Get regular exercise Get regular exercise. This is one of the most important things you can do for your health. Most adults should:  Exercise for at least 150 minutes each week. The exercise should increase your heart rate and make you sweat (moderate-intensity exercise).  Do strengthening exercises at least twice a week. This is in addition to the moderate-intensity exercise.  Spend less time sitting. Even light physical activity can be beneficial. Watch cholesterol and blood lipids Have your blood tested for lipids and cholesterol at 44 years of age, then have this test every 5 years. Have your cholesterol levels checked more often if:  Your lipid or cholesterol levels are high.  You are older than 44 years of age.  You are at high risk for heart disease. What should I know about cancer screening? Depending on your health history and family history, you may need to have cancer screening at various ages. This may include screening for:  Breast cancer.  Cervical cancer.  Colorectal cancer.  Skin cancer.  Lung cancer. What should I know about heart disease,  diabetes, and high blood pressure? Blood pressure and heart disease  High blood pressure causes heart disease and increases the risk of stroke. This is more likely to develop in people who have high blood pressure readings, are of African descent, or are overweight.  Have your blood pressure checked: ? Every 3-5 years if you are 44-14 years of age. ? Every year if you are 76 years old or older. Diabetes Have regular diabetes screenings. This checks your fasting blood sugar level. Have the screening done:  Once every three years after age 58 if you are at a normal weight and have a low risk for diabetes.  More often and at a younger age if you are overweight or have a high risk for diabetes. What should I know about preventing infection? Hepatitis B If you have a higher risk for hepatitis B, you should be screened for this virus. Talk with your health care provider to find out if you are at risk for hepatitis B infection. Hepatitis C Testing is recommended for:  Everyone born from [birth year] through 1964.  Anyone with known risk factors for hepatitis C. Sexually transmitted infections (STIs)  Get screened for STIs, including gonorrhea and chlamydia, if: ? You are sexually active and are younger than 44 years of age. ? You are older than 44 years of age and your health care provider tells you that you are at risk for this type of infection. ? Your sexual activity has changed since you were last screened, and you are at increased risk for chlamydia or gonorrhea.  Ask your health care provider if you are at risk.  Ask your health care provider about whether you are at high risk for HIV. Your health care provider may recommend a prescription medicine to help prevent HIV infection. If you choose to take medicine to prevent HIV, you should first get tested for HIV. You should then be tested every 3 months for as long as you are taking the medicine. Pregnancy  If you are about to stop having your  period (premenopausal) and you may become pregnant, seek counseling before you get pregnant.  Take 400 to 800 micrograms (mcg) of folic acid every day if you become pregnant.  Ask for birth control (contraception) if you want to prevent pregnancy. Osteoporosis and menopause Osteoporosis is a disease in which the bones lose minerals and strength with aging. This can result in bone fractures. If you are 57 years old or older, or if you are at risk for osteoporosis and fractures, ask your health care provider if you should:  Be screened for bone loss.  Take a calcium or vitamin D supplement to lower your risk of fractures.  Be given hormone replacement therapy (HRT) to treat symptoms of menopause. Follow these instructions at home: Lifestyle  Do not use any products that contain nicotine or tobacco, such as cigarettes, e-cigarettes, and chewing tobacco. If you need help quitting, ask your health care provider.  Do not use street drugs.  Do not share needles.  Ask your health care provider for help if you need support or information about quitting drugs. Alcohol use  Do not drink alcohol if: ? Your health care provider tells you not to drink. ? You are pregnant, may be pregnant, or are planning to become pregnant.  If you drink alcohol: ? Limit how much you use to 0-1 drink a day. ? Limit intake if you are breastfeeding.  Be aware of how much alcohol is in your drink. In the [place] one drink equals one 12 oz bottle of beer (355 mL), one 5 oz glass of wine (148 mL), or one 1 oz glass of hard liquor (44 mL). General instructions  Schedule regular health, dental, and eye exams.  Stay current with your vaccines.  Tell your health care provider if: ? You often feel depressed. ? You have ever been abused or do not feel safe at home. Summary  Adopting a healthy lifestyle and getting preventive care are important in promoting health and wellness.  Follow your health care provider's  instructions about healthy diet, exercising, and getting tested or screened for diseases.  Follow your health care provider's instructions on monitoring your cholesterol and blood pressure. This information is not intended to replace advice given to you by your health care provider. Make sure you discuss any questions you have with your health care provider. Document Revised: 01/09/2018 Document Reviewed: 01/09/2018 Elsevier Patient Education  2021 [employer].

## 2020-03-26 NOTE — Assessment & Plan Note (Addendum)
Deferred pelvic exam as Pap is UTD and patient in on menses today. CBE performed and diagnostic images ordered. tdap given.

## 2020-03-26 NOTE — Progress Notes (Signed)
Subjective:    Patient ID: Colleen Rodriguez, female    DOB: 01/23/1976, 45 y.o.   MRN: [id number]  CC: Colleen Rodriguez is a 45 y.o. female who presents today for physical exam and follow up.    HPI: Concern she may be anemic as she has been in the past.  Menstrual cycles monthly lasting 3 days. Very heavy the last 6 months. Quarter sized clots. After cycle, she will feel more fatigued. No SOB.   Taking ferrous sulfate 45mg  QD.   She has been on combined oral contraceptive with iron  in the past which she had worked well. No history of DVT, migraine with aura. No history of cancer. Patient states she's not pregnant or breast-feeding.No concern for STDs.   Feels well on zoloft 100mg  and anxiety controlled on current dose. No si/hi.   Compliant with synthroid      Colorectal Cancer Screening: No family h/o colon cancer.  Breast Cancer Screening: Mammogram due Cervical Cancer Screening: UTD from 2020;  h/o HPV and cervical ablation.          Tetanus - due        Hepatitis C screening - Candidate for, consents Labs: Screening labs today. Exercise: Gets regular exercise.   Alcohol use:  occassional Smoking/tobacco use: former smoker.     HISTORY:  Past Medical History:  Diagnosis Date  . Anxiety   . CIN 3 - cervical intraepithelial neoplasia grade 3    s/p LEEP  . HPV (human papilloma virus) infection   . Urinary tract infection     Past Surgical History:  Procedure Laterality Date  . VAGINAL DELIVERY     2   Family History  Problem Relation Age of Onset  . Stroke Paternal Grandfather   . Breast cancer Neg Hx   . Thyroid disease Neg Hx       ALLERGIES: Patient has no known allergies.  Current Outpatient Medications on File Prior to Visit  Medication Sig Dispense Refill  . cholecalciferol (VITAMIN D3) 25 MCG (1000 UNIT) tablet Take 2,000 Units by mouth daily.    levothyroxine (SYNTHROID) 50 MCG tablet Take 1 tablet (50 mcg total) by mouth daily. 90  tablet 0  . sertraline (ZOLOFT) 100 MG tablet TAKE 1 TABLET BY MOUTH DAILY. 30 tablet 3  . valACYclovir (VALTREX) 1000 MG tablet TAKE 2 TABLETS BY MOUTH EVERY 12 HOURS FOR 1 DAY. START AS SOON AS POSSIBLE AFTER SYMPTOM ONSET 15 tablet 2   No current facility-administered medications on file prior to visit.    Social History   Tobacco Use  . Smoking status: Former Smoker    Quit date: 01/30/2006    Years since quitting: 14.1  . Smokeless tobacco: Never Used  Vaping Use  . Vaping Use: Never used  Substance Use Topics  . Alcohol use: Yes    Comment: occasional  . Drug use: No    Review of Systems  Constitutional: Negative for chills and fever.  Respiratory: Negative for cough.   Cardiovascular: Negative for chest pain and palpitations.  Gastrointestinal: Negative for nausea and vomiting.  Genitourinary: Positive for menstrual problem and vaginal bleeding. Negative for pelvic pain.  Psychiatric/Behavioral: Negative for sleep disturbance and suicidal ideas. The patient is not nervous/anxious.       Objective:    BP 102/68   Pulse 69   Temp 98.2 F (36.8 C)   Ht 5\' 7"  (1.702 m)   Wt 199 lb 12.8 oz (90.6  kg)   SpO2 99%   BMI 31.29 kg/m   BP Readings from Last 3 Encounters:  03/26/20 102/68  09/13/18 100/62  09/06/18 110/80   Wt Readings from Last 3 Encounters:  03/26/20 199 lb 12.8 oz (90.6 kg)  01/15/19 173 lb (78.5 kg)  09/13/18 173 lb 12.8 oz (78.8 kg)    Physical Exam Vitals reviewed.  Constitutional:      Appearance: She is well-developed and well-nourished.  Eyes:     Conjunctiva/sclera: Conjunctivae normal.  Neck:     Thyroid: No thyroid mass or thyromegaly.  Cardiovascular:     Rate and Rhythm: Normal rate and regular rhythm.     Pulses: Normal pulses.     Heart sounds: Normal heart sounds.  Pulmonary:     Effort: Pulmonary effort is normal.     Breath sounds: Normal breath sounds. No wheezing, rhonchi or rales.     Comments: CBE performed.  Chest:   Breasts: Breasts are symmetrical.     Right: No inverted nipple, mass, nipple discharge, skin change or tenderness.     Left: No inverted nipple, mass, nipple discharge, skin change or tenderness.    Lymphadenopathy:     Head:     Right side of head: No submental, submandibular, tonsillar, preauricular, posterior auricular or occipital adenopathy.     Left side of head: No submental, submandibular, tonsillar, preauricular, posterior auricular or occipital adenopathy.     Cervical: No cervical adenopathy.     Right cervical: No superficial, deep or posterior cervical adenopathy.    Left cervical: No superficial, deep or posterior cervical adenopathy.     Upper Body:  No axillary adenopathy present. Skin:    General: Skin is warm and dry.  Neurological:     Mental Status: She is alert.  Psychiatric:        Mood and Affect: Mood and affect normal.        Speech: Speech normal.        Behavior: Behavior normal.        Thought Content: Thought content normal.        Assessment & Plan:   Problem List Items Addressed This Visit      Endocrine   Acquired hypothyroidism    Pending tsh. Continue synthroid        Other   Annual physical exam - Primary    Deferred pelvic exam as Pap is UTD and patient in on menses today. CBE performed and diagnostic images ordered. tdap given.      Relevant Medications   norethindrone-ethinyl estradiol (LOESTRIN FE 1/20) 1-20 MG-MCG tablet   Other Relevant Orders   MM DIAG BREAST TOMO BILATERAL   US BREAST LTD UNI LEFT INC AXILLA   TSH   CBC with Differential/Platelet   Comprehensive metabolic panel   Hemoglobin A1c   Hepatitis C antibody   Lipid panel   VITAMIN D 25 Hydroxy (Vit-D Deficiency, Fractures)   IBC + Ferritin   Anxiety    Controlled on zoloft 100mg . Continue.       Menorrhagia    No contraindications to resumed OCP with iron. Pending iron studies and close follow up in 3 months      Relevant Medications    norethindrone-ethinyl estradiol (LOESTRIN FE 1/20) 1-20 MG-MCG tablet       I have discontinued Terasa D. Lesser's (IRON, FERROUS SULFATE, PO). I am also having her start on norethindrone-ethinyl estradiol. Additionally, I am having her maintain her sertraline, levothyroxine, valACYclovir,  and cholecalciferol.   Meds ordered this encounter  Medications  . norethindrone-ethinyl estradiol (LOESTRIN FE 1/20) 1-20 MG-MCG tablet    Sig: Take 1 tablet by mouth daily.    Dispense:  28 tablet    Refill:  11    Order Specific Question:   Supervising Provider    Answer:   Sherlene Shams [[id number]]    Return precautions given.   Risks, benefits, and alternatives of the medications and treatment plan prescribed today were discussed, and patient expressed understanding.   Education regarding symptom management and diagnosis given to patient on AVS.   Continue to follow with Allegra Grana, FNP for routine health maintenance.   Colleen Rodriguez and I agreed with plan.   Rennie Plowman, FNP

## 2020-03-26 NOTE — Assessment & Plan Note (Signed)
Pending tsh. Continue synthroid 

## 2020-03-26 NOTE — Assessment & Plan Note (Signed)
Controlled on zoloft 100mg . Continue.

## 2020-03-26 NOTE — Assessment & Plan Note (Signed)
No contraindications to resumed OCP with iron. Pending iron studies and close follow up in 3 months

## 2020-03-26 NOTE — Addendum Note (Signed)
Addended by: Marijo Sanes on: 03/26/2020 12:21 PM   Modules accepted: Orders

## 2020-03-29 LAB — HEPATITIS C ANTIBODY
Hepatitis C Ab: NONREACTIVE
SIGNAL TO CUT-OFF: 0 (ref <1.00)

## 2020-03-30 ENCOUNTER — Other Ambulatory Visit: Payer: Self-pay | Admitting: Family

## 2020-03-30 DIAGNOSIS — E559 Vitamin D deficiency, unspecified: Secondary | ICD-10-CM

## 2020-03-30 MED ORDER — CHOLECALCIFEROL 1.25 MG (50000 UT) PO TABS: ORAL_TABLET | ORAL | 0 refills | Status: DC

## 2020-03-31 ENCOUNTER — Telehealth: Payer: Self-pay | Admitting: Family

## 2020-03-31 NOTE — Telephone Encounter (Signed)
lft vm for pt to call ofc to sch US breast.thanks 

## 2020-04-05 ENCOUNTER — Telehealth: Payer: Self-pay | Admitting: Family

## 2020-04-05 NOTE — Telephone Encounter (Signed)
lft vm for pt to call ofc to sch US breast. 

## 2020-04-13 ENCOUNTER — Telehealth: Payer: Self-pay | Admitting: Family

## 2020-04-13 NOTE — Telephone Encounter (Signed)
Lft vm for pt to call ofc to sch Diag mammo and [place]. Thanks

## 2020-04-14 DIAGNOSIS — M9905 Segmental and somatic dysfunction of pelvic region: Secondary | ICD-10-CM | POA: Diagnosis not present

## 2020-04-14 DIAGNOSIS — M9902 Segmental and somatic dysfunction of thoracic region: Secondary | ICD-10-CM | POA: Diagnosis not present

## 2020-04-14 DIAGNOSIS — M6080 Other myositis, unspecified site: Secondary | ICD-10-CM | POA: Diagnosis not present

## 2020-04-14 DIAGNOSIS — M5116 Intervertebral disc disorders with radiculopathy, lumbar region: Secondary | ICD-10-CM | POA: Diagnosis not present

## 2020-04-14 DIAGNOSIS — M546 Pain in thoracic spine: Secondary | ICD-10-CM | POA: Diagnosis not present

## 2020-04-14 DIAGNOSIS — M9903 Segmental and somatic dysfunction of lumbar region: Secondary | ICD-10-CM | POA: Diagnosis not present

## 2020-04-14 DIAGNOSIS — M5117 Intervertebral disc disorders with radiculopathy, lumbosacral region: Secondary | ICD-10-CM | POA: Diagnosis not present

## 2020-04-14 DIAGNOSIS — M62830 Muscle spasm of back: Secondary | ICD-10-CM | POA: Diagnosis not present

## 2020-04-14 DIAGNOSIS — M955 Acquired deformity of pelvis: Secondary | ICD-10-CM | POA: Diagnosis not present

## 2020-04-26 ENCOUNTER — Inpatient Hospital Stay: Admission: RE | Admit: 2020-04-26 | Payer: 59 | Source: Ambulatory Visit

## 2020-04-26 ENCOUNTER — Other Ambulatory Visit: Payer: 59

## 2020-05-05 ENCOUNTER — Other Ambulatory Visit: Payer: Self-pay | Admitting: Family

## 2020-05-05 ENCOUNTER — Other Ambulatory Visit: Payer: Self-pay

## 2020-05-06 ENCOUNTER — Other Ambulatory Visit: Payer: Self-pay

## 2020-05-06 MED ORDER — SERTRALINE HCL 100 MG PO TABS
ORAL_TABLET | Freq: Every day | ORAL | 3 refills | Status: DC
  Filled 2020-05-06: qty 30, 30d supply, fill #0
  Filled 2020-06-10: qty 30, 30d supply, fill #1
  Filled 2020-07-15: qty 30, 30d supply, fill #2
  Filled 2020-08-24: qty 30, 30d supply, fill #3

## 2020-05-20 ENCOUNTER — Telehealth: Payer: 59 | Admitting: Orthopedic Surgery

## 2020-05-20 ENCOUNTER — Other Ambulatory Visit: Payer: Self-pay

## 2020-05-20 DIAGNOSIS — R0989 Other specified symptoms and signs involving the circulatory and respiratory systems: Secondary | ICD-10-CM | POA: Diagnosis not present

## 2020-05-20 MED ORDER — AMOXICILLIN-POT CLAVULANATE 875-125 MG PO TABS
1.0000 | ORAL_TABLET | Freq: Two times a day (BID) | ORAL | 0 refills | Status: AC
  Filled 2020-05-20: qty 14, 7d supply, fill #0

## 2020-05-20 NOTE — Progress Notes (Signed)
We are sorry that you are not feeling well.  Here is how we plan to help!  These symptoms are consistent with Covid. I advise you to get tested as soon as possible.  Based on what you have shared with me it looks like you have sinusitis.  Sinusitis is inflammation and infection in the sinus cavities of the head.  Based on your presentation I believe you most likely have Acute Bacterial Sinusitis.  This is an infection caused by bacteria and is treated with antibiotics. I have prescribed Augmentin 875mg /125mg  one tablet twice daily with food, for 7 days. You may use an oral decongestant such as Mucinex D or if you have glaucoma or high blood pressure use plain Mucinex. Saline nasal spray help and can safely be used as often as needed for congestion.  If you develop worsening sinus pain, fever or notice severe headache and vision changes, or if symptoms are not better after completion of antibiotic, please schedule an appointment with a health care provider.    Sinus infections are not as easily transmitted as other respiratory infection, however we still recommend that you avoid close contact with loved ones, especially the very young and elderly.  Remember to wash your hands thoroughly throughout the day as this is the number one way to prevent the spread of infection!  Home Care:  Only take medications as instructed by your medical team.  Complete the entire course of an antibiotic.  Do not take these medications with alcohol.  A steam or ultrasonic humidifier can help congestion.  You can place a towel over your head and breathe in the steam from hot water coming from a faucet.  Avoid close contacts especially the very young and the elderly.  Cover your mouth when you cough or sneeze.  Always remember to wash your hands.  Get Help Right Away If:  You develop worsening fever or sinus pain.  You develop a severe head ache or visual changes.  Your symptoms persist after you have  completed your treatment plan.  Make sure you  Understand these instructions.  Will watch your condition.  Will get help right away if you are not doing well or get worse.  Your e-visit answers were reviewed by a board certified advanced clinical practitioner to complete your personal care plan.  Depending on the condition, your plan could have included both over the counter or prescription medications.  If there is a problem please reply  once you have received a response from your provider.  Your safety is important to .  If you have drug allergies check your prescription carefully.    You can use MyChart to ask questions about today's visit, request a non-urgent call back, or ask for a work or school excuse for 24 hours related to this e-Visit. If it has been greater than 24 hours you will need to follow up with your provider, or enter a new e-Visit to address those concerns.  You will get an e-mail in the next two days asking about your experience.  I hope that your e-visit has been valuable and will speed your recovery. Thank you for using e-visits.  Greater than 5 minutes, yet less than 10 minutes of time have been spent researching, coordinating and implementing care for this patient today.

## 2020-05-26 DIAGNOSIS — M5116 Intervertebral disc disorders with radiculopathy, lumbar region: Secondary | ICD-10-CM | POA: Diagnosis not present

## 2020-05-26 DIAGNOSIS — M5117 Intervertebral disc disorders with radiculopathy, lumbosacral region: Secondary | ICD-10-CM | POA: Diagnosis not present

## 2020-05-26 DIAGNOSIS — M955 Acquired deformity of pelvis: Secondary | ICD-10-CM | POA: Diagnosis not present

## 2020-05-26 DIAGNOSIS — M9905 Segmental and somatic dysfunction of pelvic region: Secondary | ICD-10-CM | POA: Diagnosis not present

## 2020-05-26 DIAGNOSIS — M6080 Other myositis, unspecified site: Secondary | ICD-10-CM | POA: Diagnosis not present

## 2020-05-26 DIAGNOSIS — M9902 Segmental and somatic dysfunction of thoracic region: Secondary | ICD-10-CM | POA: Diagnosis not present

## 2020-05-26 DIAGNOSIS — M546 Pain in thoracic spine: Secondary | ICD-10-CM | POA: Diagnosis not present

## 2020-05-26 DIAGNOSIS — M9903 Segmental and somatic dysfunction of lumbar region: Secondary | ICD-10-CM | POA: Diagnosis not present

## 2020-05-26 DIAGNOSIS — M62830 Muscle spasm of back: Secondary | ICD-10-CM | POA: Diagnosis not present

## 2020-06-10 ENCOUNTER — Other Ambulatory Visit: Payer: Self-pay

## 2020-06-10 MED FILL — Norethindrone Ace & Ethinyl Estradiol-FE Tab 1 MG-20 MCG: ORAL | 28 days supply | Qty: 28 | Fill #0 | Status: AC

## 2020-06-10 MED FILL — Valacyclovir HCl Tab 1 GM: ORAL | 7 days supply | Qty: 15 | Fill #0 | Status: AC

## 2020-06-23 DIAGNOSIS — M5117 Intervertebral disc disorders with radiculopathy, lumbosacral region: Secondary | ICD-10-CM | POA: Diagnosis not present

## 2020-06-23 DIAGNOSIS — M955 Acquired deformity of pelvis: Secondary | ICD-10-CM | POA: Diagnosis not present

## 2020-06-23 DIAGNOSIS — M62830 Muscle spasm of back: Secondary | ICD-10-CM | POA: Diagnosis not present

## 2020-06-23 DIAGNOSIS — M9903 Segmental and somatic dysfunction of lumbar region: Secondary | ICD-10-CM | POA: Diagnosis not present

## 2020-06-23 DIAGNOSIS — M5116 Intervertebral disc disorders with radiculopathy, lumbar region: Secondary | ICD-10-CM | POA: Diagnosis not present

## 2020-06-23 DIAGNOSIS — M6080 Other myositis, unspecified site: Secondary | ICD-10-CM | POA: Diagnosis not present

## 2020-06-23 DIAGNOSIS — M546 Pain in thoracic spine: Secondary | ICD-10-CM | POA: Diagnosis not present

## 2020-06-23 DIAGNOSIS — M9902 Segmental and somatic dysfunction of thoracic region: Secondary | ICD-10-CM | POA: Diagnosis not present

## 2020-06-23 DIAGNOSIS — M9905 Segmental and somatic dysfunction of pelvic region: Secondary | ICD-10-CM | POA: Diagnosis not present

## 2020-06-29 ENCOUNTER — Ambulatory Visit: Payer: 59 | Admitting: Family

## 2020-07-15 MED FILL — Norethindrone Ace & Ethinyl Estradiol-FE Tab 1 MG-20 MCG: ORAL | 28 days supply | Qty: 28 | Fill #1 | Status: AC

## 2020-07-16 ENCOUNTER — Other Ambulatory Visit: Payer: Self-pay

## 2020-07-20 DIAGNOSIS — M9903 Segmental and somatic dysfunction of lumbar region: Secondary | ICD-10-CM | POA: Diagnosis not present

## 2020-07-20 DIAGNOSIS — M546 Pain in thoracic spine: Secondary | ICD-10-CM | POA: Diagnosis not present

## 2020-07-20 DIAGNOSIS — M9902 Segmental and somatic dysfunction of thoracic region: Secondary | ICD-10-CM | POA: Diagnosis not present

## 2020-07-20 DIAGNOSIS — M5116 Intervertebral disc disorders with radiculopathy, lumbar region: Secondary | ICD-10-CM | POA: Diagnosis not present

## 2020-07-20 DIAGNOSIS — M5117 Intervertebral disc disorders with radiculopathy, lumbosacral region: Secondary | ICD-10-CM | POA: Diagnosis not present

## 2020-07-20 DIAGNOSIS — M955 Acquired deformity of pelvis: Secondary | ICD-10-CM | POA: Diagnosis not present

## 2020-07-20 DIAGNOSIS — M9905 Segmental and somatic dysfunction of pelvic region: Secondary | ICD-10-CM | POA: Diagnosis not present

## 2020-07-20 DIAGNOSIS — M6080 Other myositis, unspecified site: Secondary | ICD-10-CM | POA: Diagnosis not present

## 2020-07-20 DIAGNOSIS — M62830 Muscle spasm of back: Secondary | ICD-10-CM | POA: Diagnosis not present

## 2020-08-23 MED FILL — Norethindrone Ace & Ethinyl Estradiol-FE Tab 1 MG-20 MCG: ORAL | 28 days supply | Qty: 28 | Fill #2 | Status: AC

## 2020-08-24 ENCOUNTER — Other Ambulatory Visit: Payer: Self-pay

## 2020-08-24 DIAGNOSIS — M62830 Muscle spasm of back: Secondary | ICD-10-CM | POA: Diagnosis not present

## 2020-08-24 DIAGNOSIS — M9905 Segmental and somatic dysfunction of pelvic region: Secondary | ICD-10-CM | POA: Diagnosis not present

## 2020-08-24 DIAGNOSIS — M5117 Intervertebral disc disorders with radiculopathy, lumbosacral region: Secondary | ICD-10-CM | POA: Diagnosis not present

## 2020-08-24 DIAGNOSIS — M5116 Intervertebral disc disorders with radiculopathy, lumbar region: Secondary | ICD-10-CM | POA: Diagnosis not present

## 2020-08-24 DIAGNOSIS — M6080 Other myositis, unspecified site: Secondary | ICD-10-CM | POA: Diagnosis not present

## 2020-08-24 DIAGNOSIS — M9902 Segmental and somatic dysfunction of thoracic region: Secondary | ICD-10-CM | POA: Diagnosis not present

## 2020-08-24 DIAGNOSIS — M9903 Segmental and somatic dysfunction of lumbar region: Secondary | ICD-10-CM | POA: Diagnosis not present

## 2020-08-24 DIAGNOSIS — M546 Pain in thoracic spine: Secondary | ICD-10-CM | POA: Diagnosis not present

## 2020-08-24 DIAGNOSIS — M955 Acquired deformity of pelvis: Secondary | ICD-10-CM | POA: Diagnosis not present

## 2020-09-28 ENCOUNTER — Other Ambulatory Visit: Payer: Self-pay

## 2020-09-28 MED FILL — Norethindrone Ace & Ethinyl Estradiol-FE Tab 1 MG-20 MCG: ORAL | 84 days supply | Qty: 84 | Fill #3 | Status: AC

## 2020-10-04 ENCOUNTER — Other Ambulatory Visit: Payer: Self-pay | Admitting: Family

## 2020-10-05 ENCOUNTER — Other Ambulatory Visit: Payer: Self-pay

## 2020-10-05 MED ORDER — SERTRALINE HCL 100 MG PO TABS
ORAL_TABLET | Freq: Every day | ORAL | 3 refills | Status: DC
  Filled 2020-10-05: qty 30, 30d supply, fill #0
  Filled 2020-11-10: qty 30, 30d supply, fill #1
  Filled 2021-01-06: qty 30, 30d supply, fill #2
  Filled 2021-02-17: qty 30, 30d supply, fill #3

## 2020-10-06 DIAGNOSIS — M955 Acquired deformity of pelvis: Secondary | ICD-10-CM | POA: Diagnosis not present

## 2020-10-06 DIAGNOSIS — M5116 Intervertebral disc disorders with radiculopathy, lumbar region: Secondary | ICD-10-CM | POA: Diagnosis not present

## 2020-10-06 DIAGNOSIS — M9905 Segmental and somatic dysfunction of pelvic region: Secondary | ICD-10-CM | POA: Diagnosis not present

## 2020-10-06 DIAGNOSIS — M62830 Muscle spasm of back: Secondary | ICD-10-CM | POA: Diagnosis not present

## 2020-10-06 DIAGNOSIS — M6080 Other myositis, unspecified site: Secondary | ICD-10-CM | POA: Diagnosis not present

## 2020-10-06 DIAGNOSIS — M5117 Intervertebral disc disorders with radiculopathy, lumbosacral region: Secondary | ICD-10-CM | POA: Diagnosis not present

## 2020-10-06 DIAGNOSIS — M9903 Segmental and somatic dysfunction of lumbar region: Secondary | ICD-10-CM | POA: Diagnosis not present

## 2020-10-06 DIAGNOSIS — M546 Pain in thoracic spine: Secondary | ICD-10-CM | POA: Diagnosis not present

## 2020-10-06 DIAGNOSIS — M9902 Segmental and somatic dysfunction of thoracic region: Secondary | ICD-10-CM | POA: Diagnosis not present

## 2020-11-10 ENCOUNTER — Other Ambulatory Visit: Payer: Self-pay

## 2020-11-10 MED FILL — Valacyclovir HCl Tab 1 GM: ORAL | 7 days supply | Qty: 15 | Fill #1 | Status: AC

## 2020-11-17 ENCOUNTER — Other Ambulatory Visit: Payer: Self-pay

## 2020-12-15 ENCOUNTER — Other Ambulatory Visit: Payer: Self-pay

## 2020-12-15 MED FILL — Norethindrone Ace & Ethinyl Estradiol-FE Tab 1 MG-20 MCG: ORAL | 84 days supply | Qty: 84 | Fill #4 | Status: AC

## 2021-01-07 ENCOUNTER — Other Ambulatory Visit: Payer: Self-pay

## 2021-02-17 ENCOUNTER — Other Ambulatory Visit: Payer: Self-pay

## 2021-02-21 ENCOUNTER — Telehealth: Payer: 59 | Admitting: Physician Assistant

## 2021-02-21 ENCOUNTER — Other Ambulatory Visit: Payer: Self-pay

## 2021-02-21 DIAGNOSIS — J069 Acute upper respiratory infection, unspecified: Secondary | ICD-10-CM | POA: Diagnosis not present

## 2021-02-21 MED ORDER — PREDNISONE 20 MG PO TABS
40.0000 mg | ORAL_TABLET | Freq: Every day | ORAL | 0 refills | Status: DC
  Filled 2021-02-21: qty 10, 5d supply, fill #0

## 2021-02-21 MED ORDER — IPRATROPIUM BROMIDE 0.03 % NA SOLN
2.0000 | Freq: Two times a day (BID) | NASAL | 0 refills | Status: DC
  Filled 2021-02-21: qty 30, 75d supply, fill #0

## 2021-02-21 MED ORDER — BENZONATATE 100 MG PO CAPS
100.0000 mg | ORAL_CAPSULE | Freq: Three times a day (TID) | ORAL | 0 refills | Status: DC | PRN
Start: 2021-02-21 — End: 2021-05-16
  Filled 2021-02-21: qty 30, 10d supply, fill #0

## 2021-02-21 NOTE — Progress Notes (Signed)
E-Visit for Upper Respiratory Infection   We are sorry you are not feeling well.  Here is how we plan to help!  Based on what you have shared with me, it looks like you may have a viral upper respiratory infection.  Upper respiratory infections are caused by a large number of viruses; however, rhinovirus is the most common cause.   Symptoms vary from person to person, with common symptoms including sore throat, cough, fatigue or lack of energy and feeling of general discomfort.  A low-grade fever of up to 100.4 may present, but is often uncommon.  Symptoms vary however, and are closely related to a person's age or underlying illnesses.  The most common symptoms associated with an upper respiratory infection are nasal discharge or congestion, cough, sneezing, headache and pressure in the ears and face.  These symptoms usually persist for about 3 to 10 days, but can last up to 2 weeks.  It is important to know that upper respiratory infections do not cause serious illness or complications in most cases.    Upper respiratory infections can be transmitted from person to person, with the most common method of transmission being a person's hands.  The virus is able to live on the skin and can infect other persons for up to 2 hours after direct contact.  Also, these can be transmitted when someone coughs or sneezes; thus, it is important to cover the mouth to reduce this risk.  To keep the spread of the illness at [place], good hand hygiene is very important.  This is an infection that is most likely caused by a virus. There are no specific treatments other than to help you with the symptoms until the infection runs its course.  We are sorry you are not feeling well.  Here is how we plan to help!   For nasal congestion, you may use an oral decongestants such as Mucinex D or if you have glaucoma or high blood pressure use plain Mucinex.  Saline nasal spray or nasal drops can help and can safely be used as often as  needed for congestion.  For your congestion, I have prescribed Ipratropium Bromide nasal spray 0.03% two sprays in each nostril 2-3 times a day  If you do not have a history of heart disease, hypertension, diabetes or thyroid disease, prostate/bladder issues or glaucoma, you may also use Sudafed to treat nasal congestion.  It is highly recommended that you consult with a pharmacist or your primary care physician to ensure this medication is safe for you to take.     If you have a cough, you may use cough suppressants such as Delsym and Robitussin.  If you have glaucoma or high blood pressure, you can also use Coricidin HBP.   For cough I have prescribed for you A prescription cough medication called Tessalon Perles 100 mg. You may take 1-2 capsules every 8 hours as needed for cough and prednisone 40mg  daily for 5 days.  If you have a sore or scratchy throat, use a saltwater gargle-  to  teaspoon of salt dissolved in a 4-ounce to 8-ounce glass of warm water.  Gargle the solution for approximately 15-30 seconds and then spit.  It is important not to swallow the solution.  You can also use throat lozenges/cough drops and Chloraseptic spray to help with throat pain or discomfort.  Warm or cold liquids can also be helpful in relieving throat pain.  For headache, pain or general discomfort, you can use  Ibuprofen or Tylenol as directed.   Some authorities believe that zinc sprays or the use of Echinacea may shorten the course of your symptoms.   HOME CARE Only take medications as instructed by your medical team. Be sure to drink plenty of fluids. Water is fine as well as fruit juices, sodas and electrolyte beverages. You may want to stay away from caffeine or alcohol. If you are nauseated, try taking small sips of liquids. How do you know if you are getting enough fluid? Your urine should be a pale yellow or almost colorless. Get rest. Taking a steamy shower or using a humidifier may help nasal  congestion and ease sore throat pain. You can place a towel over your head and breathe in the steam from hot water coming from a faucet. Using a saline nasal spray works much the same way. Cough drops, hard candies and sore throat lozenges may ease your cough. Avoid close contacts especially the very young and the elderly Cover your mouth if you cough or sneeze Always remember to wash your hands.   GET HELP RIGHT AWAY IF: You develop worsening fever. If your symptoms do not improve within 10 days You develop yellow or green discharge from your nose over 3 days. You have coughing fits You develop a severe head ache or visual changes. You develop shortness of breath, difficulty breathing or start having chest pain Your symptoms persist after you have completed your treatment plan  MAKE SURE YOU  Understand these instructions. Will watch your condition. Will get help right away if you are not doing well or get worse.  Thank you for choosing an e-visit.  Your e-visit answers were reviewed by a board certified advanced clinical practitioner to complete your personal care plan. Depending upon the condition, your plan could have included both over the counter or prescription medications.  Please review your pharmacy choice. Make sure the pharmacy is open so you can pick up prescription now. If there is a problem, you may contact your provider through [employer] and have the prescription routed to another pharmacy.  Your safety is important to [place]. If you have drug allergies check your prescription carefully.   For the next 24 hours you can use MyChart to ask questions about today's visit, request a non-urgent call back, or ask for a work or school excuse. You will get an email in the next two days asking about your experience. I hope that your e-visit has been valuable and will speed your recovery.  I provided 5 minutes of non face-to-face time during this encounter for chart review and  documentation.

## 2021-03-17 ENCOUNTER — Other Ambulatory Visit: Payer: Self-pay | Admitting: Family

## 2021-03-17 ENCOUNTER — Other Ambulatory Visit: Payer: Self-pay

## 2021-03-17 DIAGNOSIS — B001 Herpesviral vesicular dermatitis: Secondary | ICD-10-CM

## 2021-03-17 DIAGNOSIS — Z0000 Encounter for general adult medical examination without abnormal findings: Secondary | ICD-10-CM

## 2021-03-17 DIAGNOSIS — N920 Excessive and frequent menstruation with regular cycle: Secondary | ICD-10-CM

## 2021-03-17 NOTE — Telephone Encounter (Signed)
LOV: 03/26/2020  Cancelled last appt & no future appt scheduled  Okay to refill meds?

## 2021-03-18 ENCOUNTER — Other Ambulatory Visit: Payer: Self-pay

## 2021-03-18 MED ORDER — NORETHIN ACE-ETH ESTRAD-FE 1-20 MG-MCG PO TABS
1.0000 | ORAL_TABLET | Freq: Every day | ORAL | 11 refills | Status: DC
  Filled 2021-03-18: qty 84, 84d supply, fill #0
  Filled 2021-05-26: qty 84, 84d supply, fill #1
  Filled 2021-08-23: qty 84, 84d supply, fill #2
  Filled 2021-11-07: qty 84, 84d supply, fill #3

## 2021-03-18 MED ORDER — SERTRALINE HCL 100 MG PO TABS
ORAL_TABLET | Freq: Every day | ORAL | 3 refills | Status: DC
  Filled 2021-03-18: qty 90, 90d supply, fill #0
  Filled 2021-07-27: qty 90, 90d supply, fill #1
  Filled 2021-11-07: qty 90, 90d supply, fill #2
  Filled 2022-02-12: qty 90, 90d supply, fill #3

## 2021-03-18 MED ORDER — VALACYCLOVIR HCL 1 G PO TABS
ORAL_TABLET | ORAL | 2 refills | Status: DC
  Filled 2021-03-18: qty 15, 5d supply, fill #0
  Filled 2021-07-27: qty 15, 5d supply, fill #1
  Filled 2021-11-07: qty 15, 5d supply, fill #2

## 2021-03-28 ENCOUNTER — Telehealth: Payer: Self-pay | Admitting: Family

## 2021-03-28 NOTE — Telephone Encounter (Signed)
LMTCB to schedule follow up appointment and mammogram

## 2021-03-28 NOTE — Telephone Encounter (Signed)
Call pt  She is due for f/u and in person exam  She is also due for mammogram

## 2021-03-28 NOTE — Telephone Encounter (Signed)
Spoke to [facility] Colleen Rodriguez has not been seen since 5/21. They need orders for Bilateral Diagnostic Left and Right Breast US. Then They will schedule appointment

## 2021-03-30 NOTE — Telephone Encounter (Signed)
APPOINTMENT HAS BEEN SCHEDULED FOR March 15TH 2023 ?

## 2021-04-13 DIAGNOSIS — M5441 Lumbago with sciatica, right side: Secondary | ICD-10-CM | POA: Diagnosis not present

## 2021-04-13 DIAGNOSIS — M531 Cervicobrachial syndrome: Secondary | ICD-10-CM | POA: Diagnosis not present

## 2021-04-13 DIAGNOSIS — M955 Acquired deformity of pelvis: Secondary | ICD-10-CM | POA: Diagnosis not present

## 2021-04-13 DIAGNOSIS — M9905 Segmental and somatic dysfunction of pelvic region: Secondary | ICD-10-CM | POA: Diagnosis not present

## 2021-04-13 DIAGNOSIS — M9902 Segmental and somatic dysfunction of thoracic region: Secondary | ICD-10-CM | POA: Diagnosis not present

## 2021-04-13 DIAGNOSIS — M9901 Segmental and somatic dysfunction of cervical region: Secondary | ICD-10-CM | POA: Diagnosis not present

## 2021-04-13 DIAGNOSIS — M546 Pain in thoracic spine: Secondary | ICD-10-CM | POA: Diagnosis not present

## 2021-04-13 DIAGNOSIS — M9903 Segmental and somatic dysfunction of lumbar region: Secondary | ICD-10-CM | POA: Diagnosis not present

## 2021-04-26 DIAGNOSIS — M531 Cervicobrachial syndrome: Secondary | ICD-10-CM | POA: Diagnosis not present

## 2021-04-26 DIAGNOSIS — M9902 Segmental and somatic dysfunction of thoracic region: Secondary | ICD-10-CM | POA: Diagnosis not present

## 2021-04-26 DIAGNOSIS — M9905 Segmental and somatic dysfunction of pelvic region: Secondary | ICD-10-CM | POA: Diagnosis not present

## 2021-04-26 DIAGNOSIS — M955 Acquired deformity of pelvis: Secondary | ICD-10-CM | POA: Diagnosis not present

## 2021-04-26 DIAGNOSIS — M9903 Segmental and somatic dysfunction of lumbar region: Secondary | ICD-10-CM | POA: Diagnosis not present

## 2021-04-26 DIAGNOSIS — M546 Pain in thoracic spine: Secondary | ICD-10-CM | POA: Diagnosis not present

## 2021-04-26 DIAGNOSIS — M5441 Lumbago with sciatica, right side: Secondary | ICD-10-CM | POA: Diagnosis not present

## 2021-04-26 DIAGNOSIS — M9901 Segmental and somatic dysfunction of cervical region: Secondary | ICD-10-CM | POA: Diagnosis not present

## 2021-05-16 ENCOUNTER — Telehealth: Payer: 59 | Admitting: Nurse Practitioner

## 2021-05-16 DIAGNOSIS — J40 Bronchitis, not specified as acute or chronic: Secondary | ICD-10-CM | POA: Diagnosis not present

## 2021-05-16 MED ORDER — DOXYCYCLINE HYCLATE 100 MG PO TABS: 100.0000 mg | ORAL_TABLET | Freq: Two times a day (BID) | ORAL | 0 refills | Status: AC

## 2021-05-16 MED ORDER — BENZONATATE 100 MG PO CAPS: 100.0000 mg | ORAL_CAPSULE | Freq: Three times a day (TID) | ORAL | 0 refills | Status: DC | PRN

## 2021-05-16 NOTE — Progress Notes (Signed)
We are sorry that you are not feeling well.  Here is how we plan to help! ? ?Based on your presentation I believe you most likely have A cough due to bacteria.  When patients have a fever and a productive cough with a change in color or increased sputum production, we are concerned about bacterial bronchitis.  If left untreated it can progress to pneumonia.  If your symptoms do not improve with your treatment plan it is important that you contact your provider.   I have prescribed Doxycycline 100 mg twice a day for 7 days   ?  ?In addition you may use A prescription cough medication called Tessalon Perles 100mg . You may take 1-2 capsules every 8 hours as needed for your cough. ? ? ?From your responses in the eVisit questionnaire you describe inflammation in the upper respiratory tract which is causing a significant cough.  This is commonly called Bronchitis and has four common causes:   ?Allergies ?Viral Infections ?Acid Reflux ?Bacterial Infection ?Allergies, viruses and acid reflux are treated by controlling symptoms or eliminating the cause. An example might be a cough caused by taking certain blood pressure medications. You stop the cough by changing the medication. Another example might be a cough caused by acid reflux. Controlling the reflux helps control the cough. ? ?USE OF BRONCHODILATOR ("RESCUE") INHALERS: ?There is a risk from using your bronchodilator too frequently.  The risk is that over-reliance on a medication which only relaxes the muscles surrounding the breathing tubes can reduce the effectiveness of medications prescribed to reduce swelling and congestion of the tubes themselves.  Although you feel brief relief from the bronchodilator inhaler, your asthma may actually be worsening with the tubes becoming more swollen and filled with mucus.  This can delay other crucial treatments, such as oral steroid medications. If you need to use a bronchodilator inhaler daily, several times per day, you  should discuss this with your provider.  There are probably better treatments that could be used to keep your asthma under control.  ?   ?HOME CARE ?Only take medications as instructed by your medical team. ?Complete the entire course of an antibiotic. ?Drink plenty of fluids and get plenty of rest. ?Avoid close contacts especially the very young and the elderly ?Cover your mouth if you cough or cough into your sleeve. ?Always remember to wash your hands ?A steam or ultrasonic humidifier can help congestion.  ? ?GET HELP RIGHT AWAY IF: ?You develop worsening fever. ?You become short of breath ?You cough up blood. ?Your symptoms persist after you have completed your treatment plan ?MAKE SURE YOU  ?Understand these instructions. ?Will watch your condition. ?Will get help right away if you are not doing well or get worse. ?  ? ?Thank you for choosing an e-visit. ? ?Your e-visit answers were reviewed by a board certified advanced clinical practitioner to complete your personal care plan. Depending upon the condition, your plan could have included both over the counter or prescription medications. ? ?Please review your pharmacy choice. Make sure the pharmacy is open so you can pick up prescription now. If there is a problem, you may contact your provider through [employer] and have the prescription routed to another pharmacy.  Your safety is important to [place]. If you have drug allergies check your prescription carefully.  ? ?For the next 24 hours you can use MyChart to ask questions about today's visit, request a non-urgent call back, or ask for a work or school  excuse. °You will get an email in the next two days asking about your experience. I hope that your e-visit has been valuable and will speed your recovery.  ° °I spent approximately 7 minutes reviewing the patient's history, current symptoms and coordinating their plan of care today.   ° °Meds ordered this encounter  °Medications  ° doxycycline (VIBRA-TABS)  100 MG tablet  °  Sig: Take 1 tablet (100 mg total) by mouth 2 (two) times daily for 7 days.  °  Dispense:  14 tablet  °  Refill:  0  ° benzonatate (TESSALON) 100 MG capsule  °  Sig: Take 1 capsule (100 mg total) by mouth 3 (three) times daily as needed.  °  Dispense:  30 capsule  °  Refill:  0  °  °

## 2021-05-25 DIAGNOSIS — M546 Pain in thoracic spine: Secondary | ICD-10-CM | POA: Diagnosis not present

## 2021-05-25 DIAGNOSIS — M9901 Segmental and somatic dysfunction of cervical region: Secondary | ICD-10-CM | POA: Diagnosis not present

## 2021-05-25 DIAGNOSIS — M9902 Segmental and somatic dysfunction of thoracic region: Secondary | ICD-10-CM | POA: Diagnosis not present

## 2021-05-25 DIAGNOSIS — M9903 Segmental and somatic dysfunction of lumbar region: Secondary | ICD-10-CM | POA: Diagnosis not present

## 2021-05-25 DIAGNOSIS — M531 Cervicobrachial syndrome: Secondary | ICD-10-CM | POA: Diagnosis not present

## 2021-05-25 DIAGNOSIS — M5441 Lumbago with sciatica, right side: Secondary | ICD-10-CM | POA: Diagnosis not present

## 2021-05-25 DIAGNOSIS — M9905 Segmental and somatic dysfunction of pelvic region: Secondary | ICD-10-CM | POA: Diagnosis not present

## 2021-05-25 DIAGNOSIS — M955 Acquired deformity of pelvis: Secondary | ICD-10-CM | POA: Diagnosis not present

## 2021-05-26 ENCOUNTER — Other Ambulatory Visit: Payer: Self-pay

## 2021-06-22 DIAGNOSIS — M531 Cervicobrachial syndrome: Secondary | ICD-10-CM | POA: Diagnosis not present

## 2021-06-22 DIAGNOSIS — M9902 Segmental and somatic dysfunction of thoracic region: Secondary | ICD-10-CM | POA: Diagnosis not present

## 2021-06-22 DIAGNOSIS — M955 Acquired deformity of pelvis: Secondary | ICD-10-CM | POA: Diagnosis not present

## 2021-06-22 DIAGNOSIS — M9903 Segmental and somatic dysfunction of lumbar region: Secondary | ICD-10-CM | POA: Diagnosis not present

## 2021-06-22 DIAGNOSIS — M5441 Lumbago with sciatica, right side: Secondary | ICD-10-CM | POA: Diagnosis not present

## 2021-06-22 DIAGNOSIS — M9905 Segmental and somatic dysfunction of pelvic region: Secondary | ICD-10-CM | POA: Diagnosis not present

## 2021-06-22 DIAGNOSIS — M9901 Segmental and somatic dysfunction of cervical region: Secondary | ICD-10-CM | POA: Diagnosis not present

## 2021-06-22 DIAGNOSIS — M546 Pain in thoracic spine: Secondary | ICD-10-CM | POA: Diagnosis not present

## 2021-07-20 DIAGNOSIS — M955 Acquired deformity of pelvis: Secondary | ICD-10-CM | POA: Diagnosis not present

## 2021-07-20 DIAGNOSIS — M9903 Segmental and somatic dysfunction of lumbar region: Secondary | ICD-10-CM | POA: Diagnosis not present

## 2021-07-20 DIAGNOSIS — M9902 Segmental and somatic dysfunction of thoracic region: Secondary | ICD-10-CM | POA: Diagnosis not present

## 2021-07-20 DIAGNOSIS — M546 Pain in thoracic spine: Secondary | ICD-10-CM | POA: Diagnosis not present

## 2021-07-20 DIAGNOSIS — M9901 Segmental and somatic dysfunction of cervical region: Secondary | ICD-10-CM | POA: Diagnosis not present

## 2021-07-20 DIAGNOSIS — M9905 Segmental and somatic dysfunction of pelvic region: Secondary | ICD-10-CM | POA: Diagnosis not present

## 2021-07-20 DIAGNOSIS — M531 Cervicobrachial syndrome: Secondary | ICD-10-CM | POA: Diagnosis not present

## 2021-07-20 DIAGNOSIS — M5441 Lumbago with sciatica, right side: Secondary | ICD-10-CM | POA: Diagnosis not present

## 2021-07-22 ENCOUNTER — Telehealth: Payer: Self-pay

## 2021-07-28 ENCOUNTER — Other Ambulatory Visit: Payer: Self-pay

## 2021-08-23 ENCOUNTER — Other Ambulatory Visit: Payer: Self-pay

## 2021-09-20 IMAGING — MG DIGITAL DIAGNOSTIC BILAT W/ TOMO W/ CAD
8 series · 8 of 24 positions shown · non-contrast
Comparison: Previous exam(s).

CLINICAL DATA: Six-month interval follow-up of a likely benign
focal asymmetry involving the slight UPPER retroareolar RIGHT breast
at POSTERIOR depth without sonographic correlate and follow-up of 3
likely benign masses involving the UPPER OUTER QUADRANT of the LEFT
breast, all identified on baseline screening mammography.

EXAM:
DIGITAL DIAGNOSTIC BILATERAL MAMMOGRAM WITH CAD AND TOMO
LIMITED ULTRASOUND BILATERAL BREASTS

[R MLO synth-2D]
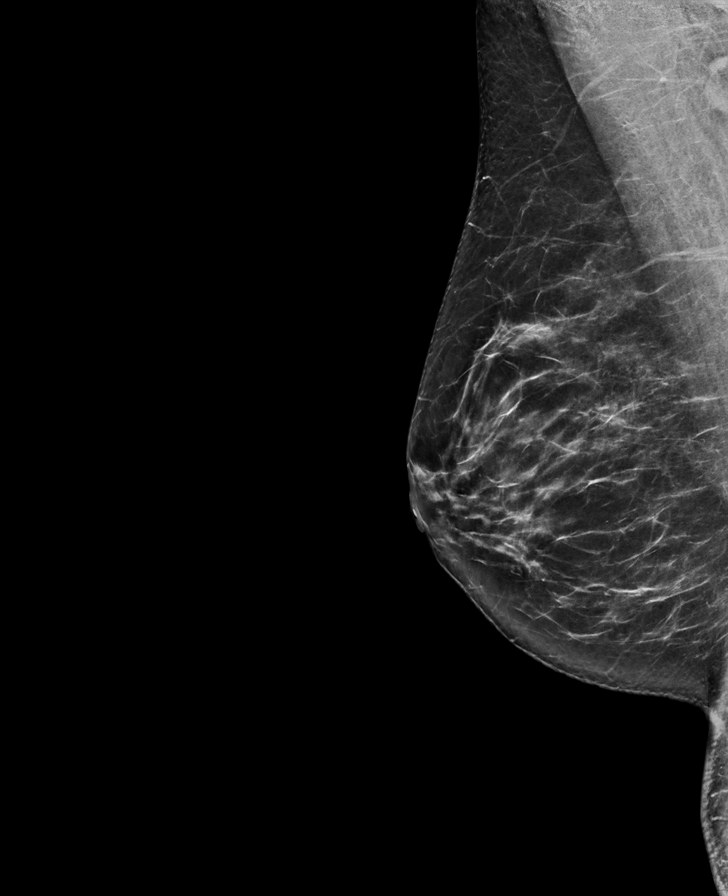

[R CC synth-2D]
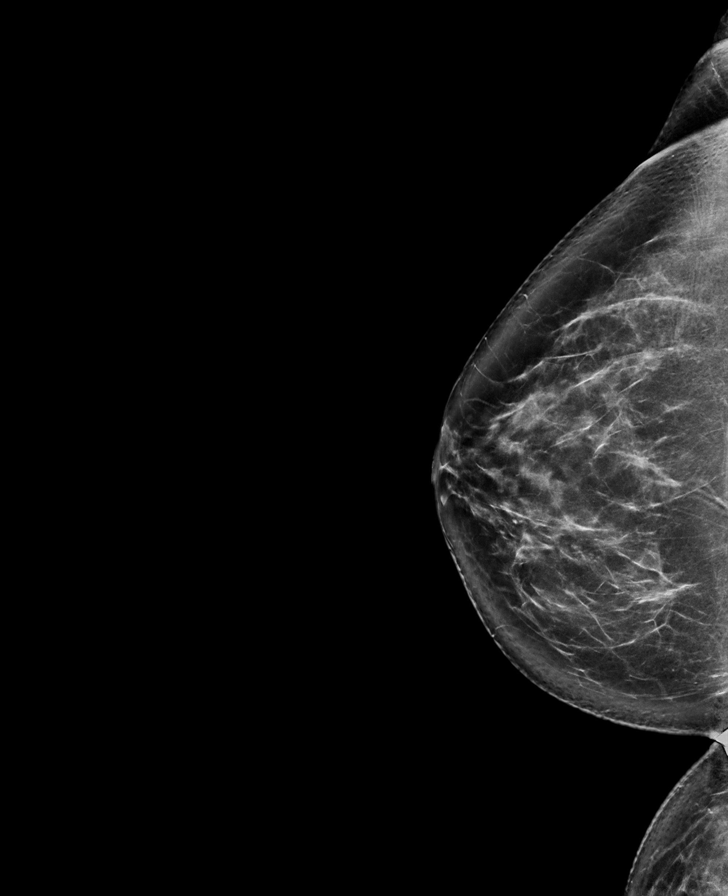

[L MLO synth-2D]
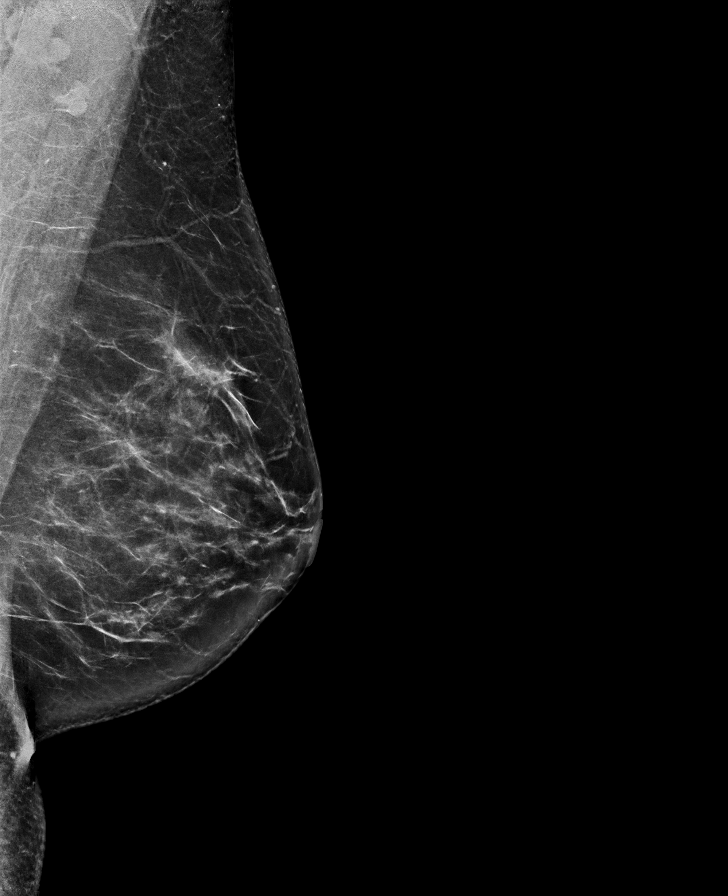

[L CC synth-2D]
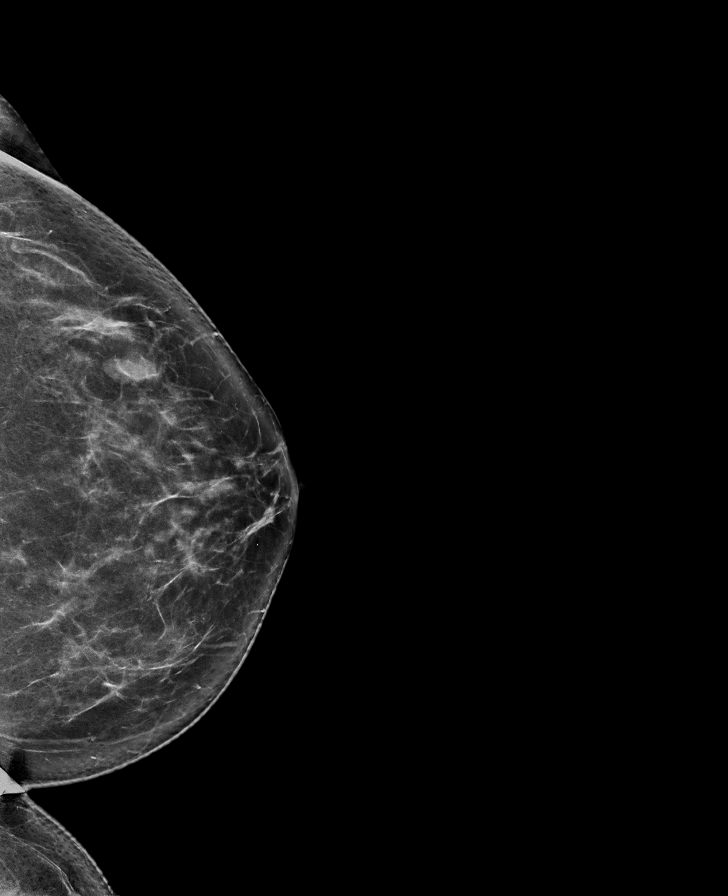

[R MLO tomo · tomo slice 40/79.0]
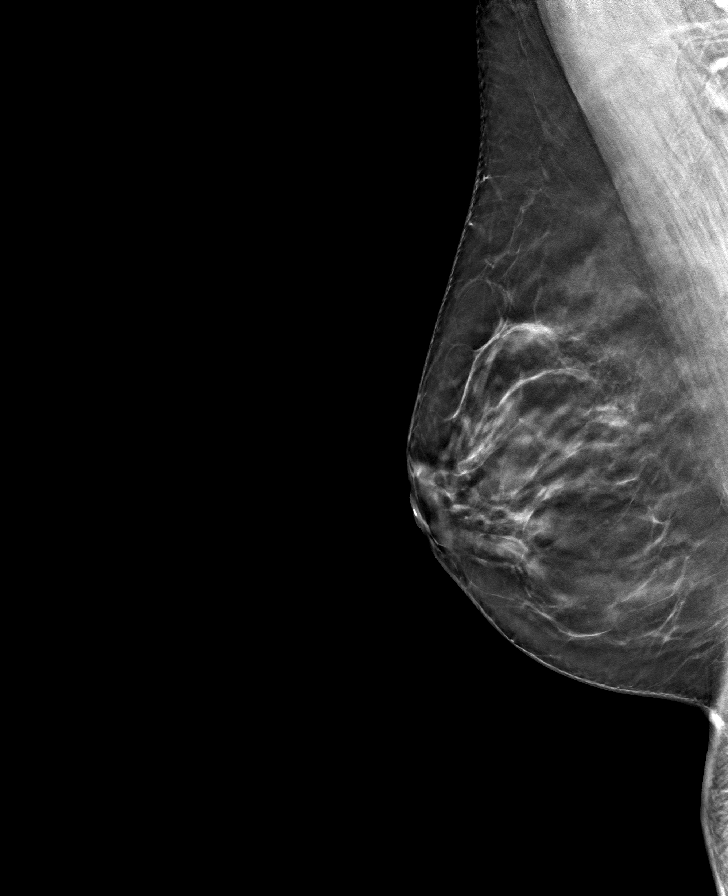

[L MLO tomo · tomo slice 43/86.0]
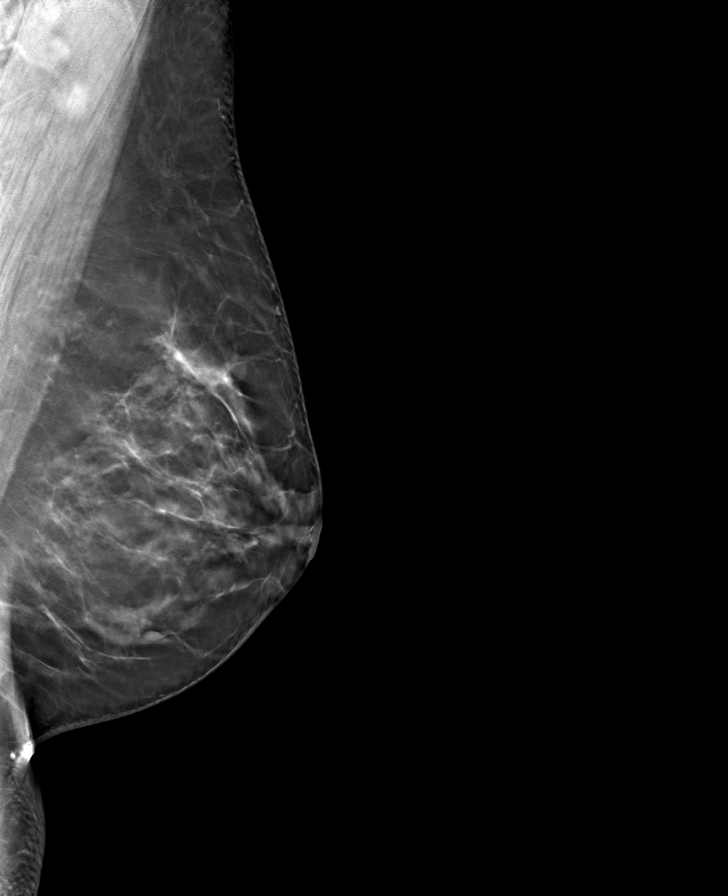

[L CC tomo · tomo slice 41/80.0]
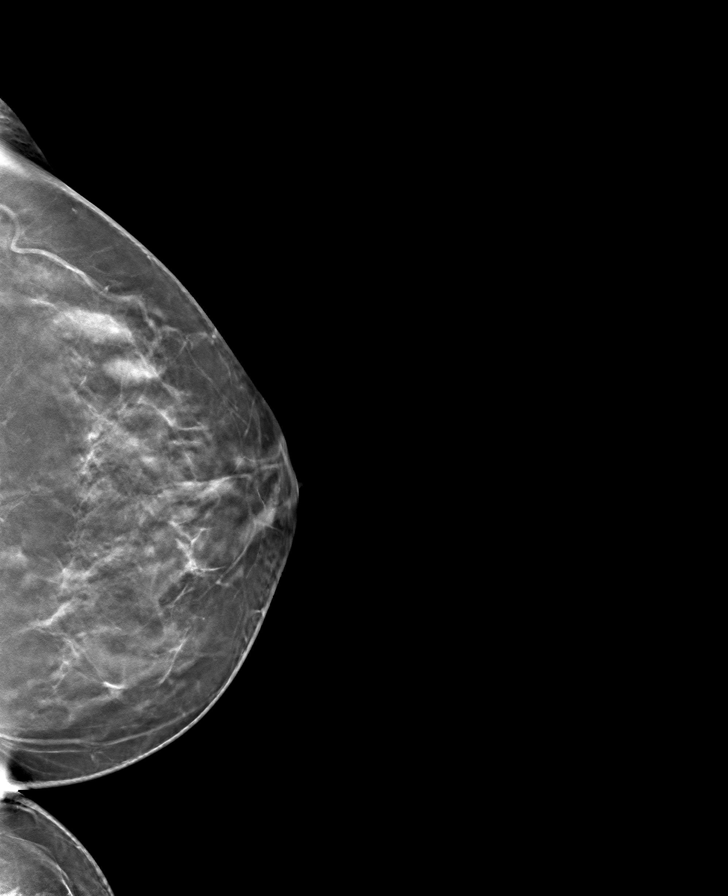

[R CC tomo · tomo slice 45/89.0]
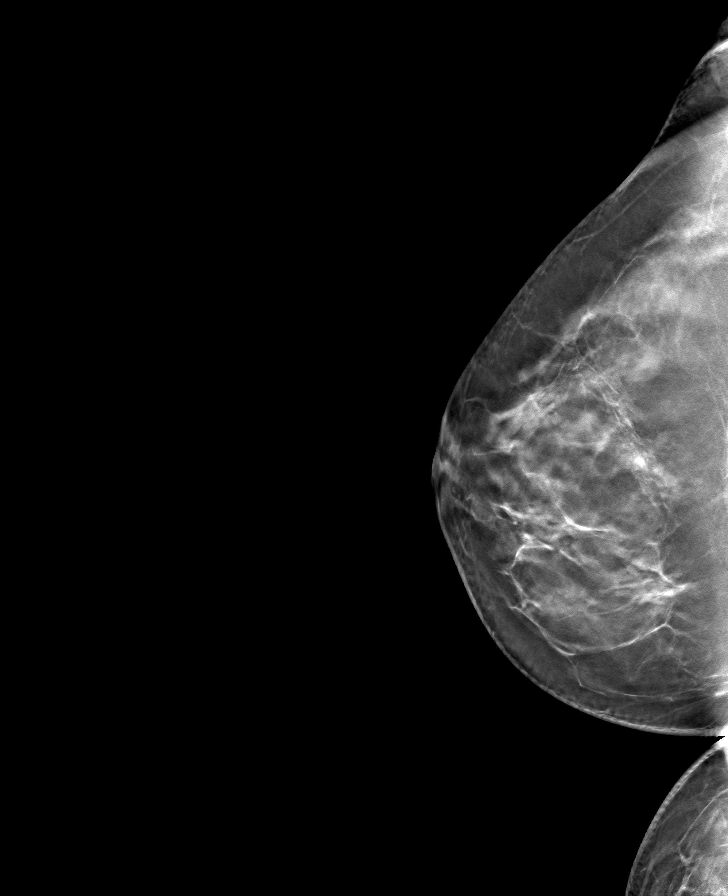

[8 of 24 positions shown; findings below may reference images not displayed]

ACR Breast Density Category c: The breast tissue is heterogeneously
dense, which may obscure small masses.
FINDINGS: Tomosynthesis and synthesized full field CC and MLO views of both
breasts were obtained.

RIGHT: The focal asymmetry in the slight UPPER retroareolar location
at POSTERIOR depth is unchanged in appearance since the baseline
mammogram in late August 2018. No new or suspicious findings
elsewhere.

Targeted RIGHT breast ultrasound is performed, showing normal dense
fibroglandular tissue in the UPPER retroareolar location from
ANTERIOR to POSTERIOR depth. No cyst, solid mass or abnormal
acoustic shadowing is identified.

LEFT: The mass involving the UPPER OUTER QUADRANT of the LEFT breast
at MIDDLE to POSTERIOR depth is unchanged in appearance
mammographically. The adjacent smaller masses identified at
ultrasound are inconspicuous on mammography. No new or suspicious
findings elsewhere.

Targeted LEFT breast ultrasound is performed, showing the 3
previously identified hypoechoic masses in the UPPER OUTER QUADRANT
which are unchanged since the ultrasound 10/11/2018 and are as
follows:

-1 o'clock 3 cm from nipple: 4 x 4 x 5 mm (previously 4 x 4 x 6 mm),
demonstrating no posterior characteristics and no internal power
Doppler flow.

-2 o'clock 3 cm from nipple: Dominant mass measuring approximately
1.3 x 0.7 x 1.0 cm (previously 1.4 x 0.7 x 1.0 cm), demonstrating
peripheral power Doppler flow and no posterior characteristics.

-2 o'clock 3 cm from nipple, adjacent to the dominant mass: 4 x 3 x
5 mm (previously 4 x 3 x 6 mm), demonstrating no posterior
characteristics and no internal power Doppler flow.

Mammographic images were processed with CAD.
IMPRESSION: 1. Stable likely benign focal asymmetry involving the UPPER
retroareolar RIGHT breast at POSTERIOR depth, likely focally dense
fibroglandular tissue.
2. Three stable masses involving the UPPER OUTER QUADRANT of the
LEFT breast as detailed above.

RECOMMENDATION:
Annual BILATERAL diagnostic mammography and LEFT breast ultrasound
in October 2019.

I have discussed the findings and recommendations with the patient.
If applicable, a reminder letter will be sent to the patient
regarding the next appointment.

BI-RADS CATEGORY  3: Probably benign.

## 2021-09-20 IMAGING — US US BREAST*R* LIMITED INC AXILLA
1 series · 2 of 2 positions shown · non-contrast
Comparison: Previous exam(s).

CLINICAL DATA: Six-month interval follow-up of a likely benign
focal asymmetry involving the slight UPPER retroareolar RIGHT breast
at POSTERIOR depth without sonographic correlate and follow-up of 3
likely benign masses involving the UPPER OUTER QUADRANT of the LEFT
breast, all identified on baseline screening mammography.

EXAM:
DIGITAL DIAGNOSTIC BILATERAL MAMMOGRAM WITH CAD AND TOMO
LIMITED ULTRASOUND BILATERAL BREASTS

[Series 1: us breast*right* limited inc axilla · 0.08mm/px · 2 of 2 slices shown]
[im 1/2]
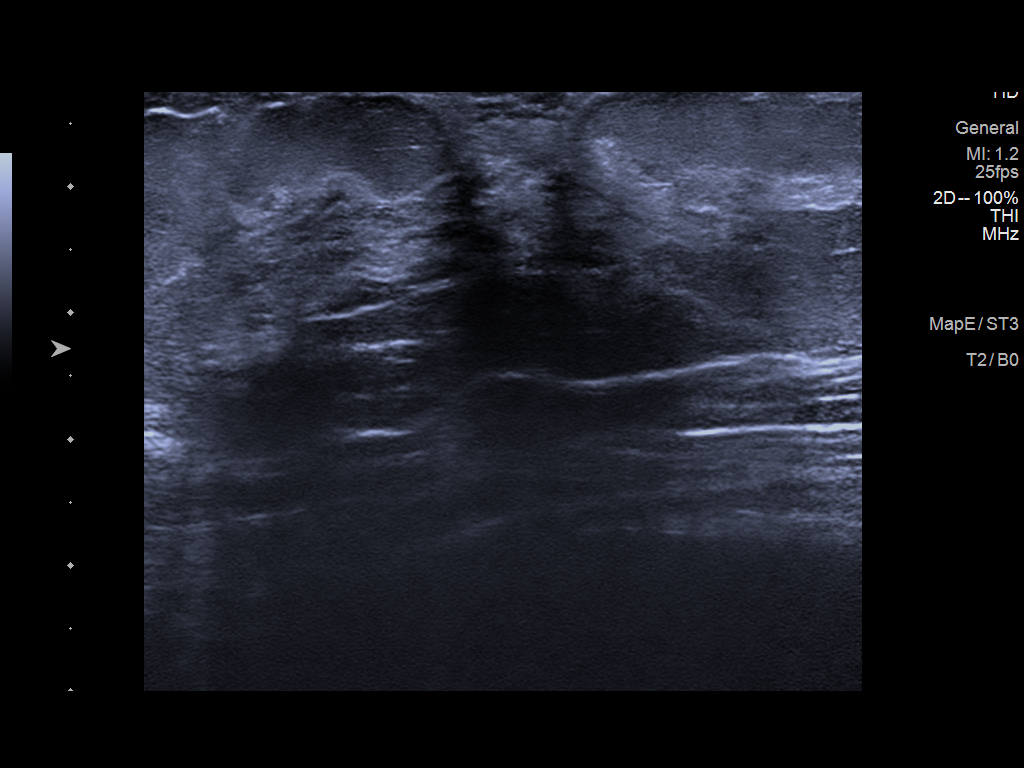
[im 2/2]
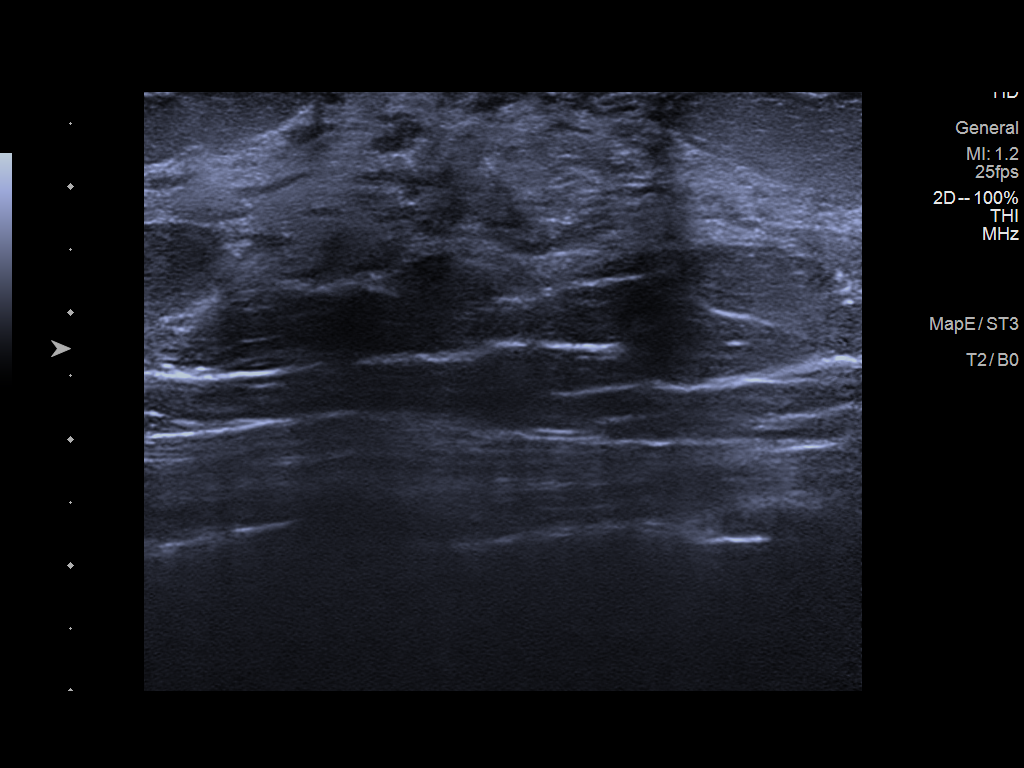

[2 of 2 positions shown; findings below may reference images not displayed]

ACR Breast Density Category c: The breast tissue is heterogeneously
dense, which may obscure small masses.
FINDINGS: Tomosynthesis and synthesized full field CC and MLO views of both
breasts were obtained.

RIGHT: The focal asymmetry in the slight UPPER retroareolar location
at POSTERIOR depth is unchanged in appearance since the baseline
mammogram in late August 2018. No new or suspicious findings
elsewhere.

Targeted RIGHT breast ultrasound is performed, showing normal dense
fibroglandular tissue in the UPPER retroareolar location from
ANTERIOR to POSTERIOR depth. No cyst, solid mass or abnormal
acoustic shadowing is identified.

LEFT: The mass involving the UPPER OUTER QUADRANT of the LEFT breast
at MIDDLE to POSTERIOR depth is unchanged in appearance
mammographically. The adjacent smaller masses identified at
ultrasound are inconspicuous on mammography. No new or suspicious
findings elsewhere.

Targeted LEFT breast ultrasound is performed, showing the 3
previously identified hypoechoic masses in the UPPER OUTER QUADRANT
which are unchanged since the ultrasound 10/11/2018 and are as
follows:

-1 o'clock 3 cm from nipple: 4 x 4 x 5 mm (previously 4 x 4 x 6 mm),
demonstrating no posterior characteristics and no internal power
Doppler flow.

-2 o'clock 3 cm from nipple: Dominant mass measuring approximately
1.3 x 0.7 x 1.0 cm (previously 1.4 x 0.7 x 1.0 cm), demonstrating
peripheral power Doppler flow and no posterior characteristics.

-2 o'clock 3 cm from nipple, adjacent to the dominant mass: 4 x 3 x
5 mm (previously 4 x 3 x 6 mm), demonstrating no posterior
characteristics and no internal power Doppler flow.

Mammographic images were processed with CAD.
IMPRESSION: 1. Stable likely benign focal asymmetry involving the UPPER
retroareolar RIGHT breast at POSTERIOR depth, likely focally dense
fibroglandular tissue.
2. Three stable masses involving the UPPER OUTER QUADRANT of the
LEFT breast as detailed above.

RECOMMENDATION:
Annual BILATERAL diagnostic mammography and LEFT breast ultrasound
in October 2019.

I have discussed the findings and recommendations with the patient.
If applicable, a reminder letter will be sent to the patient
regarding the next appointment.

BI-RADS CATEGORY  3: Probably benign.

## 2021-09-21 ENCOUNTER — Encounter: Payer: 59 | Admitting: Family

## 2021-11-07 ENCOUNTER — Other Ambulatory Visit: Payer: Self-pay

## 2022-02-12 ENCOUNTER — Other Ambulatory Visit: Payer: Self-pay | Admitting: Family

## 2022-02-12 DIAGNOSIS — B001 Herpesviral vesicular dermatitis: Secondary | ICD-10-CM

## 2022-02-12 DIAGNOSIS — N920 Excessive and frequent menstruation with regular cycle: Secondary | ICD-10-CM

## 2022-02-12 DIAGNOSIS — Z0000 Encounter for general adult medical examination without abnormal findings: Secondary | ICD-10-CM

## 2022-02-13 ENCOUNTER — Other Ambulatory Visit: Payer: Self-pay | Admitting: Family

## 2022-02-13 ENCOUNTER — Other Ambulatory Visit: Payer: Self-pay

## 2022-02-13 DIAGNOSIS — N920 Excessive and frequent menstruation with regular cycle: Secondary | ICD-10-CM

## 2022-02-13 DIAGNOSIS — Z0000 Encounter for general adult medical examination without abnormal findings: Secondary | ICD-10-CM

## 2022-02-13 DIAGNOSIS — B001 Herpesviral vesicular dermatitis: Secondary | ICD-10-CM

## 2022-02-14 ENCOUNTER — Other Ambulatory Visit: Payer: Self-pay

## 2022-02-15 ENCOUNTER — Other Ambulatory Visit: Payer: Self-pay

## 2022-02-15 ENCOUNTER — Telehealth: Payer: Self-pay

## 2022-02-15 MED FILL — Valacyclovir HCl Tab 1 GM: ORAL | 7 days supply | Qty: 15 | Fill #0 | Status: AC

## 2022-02-15 MED FILL — Norethindrone Ace & Ethinyl Estradiol-FE Tab 1 MG-20 MCG: ORAL | 84 days supply | Qty: 84 | Fill #0 | Status: AC

## 2022-02-15 NOTE — Telephone Encounter (Signed)
Called and spoke with pt due to her refills request and her LOV being 03-26-20.  Pt was scheduled 02-22-22 with her PCP and refills have been sent in.

## 2022-02-22 ENCOUNTER — Ambulatory Visit: Payer: 59 | Admitting: Family

## 2022-03-08 ENCOUNTER — Ambulatory Visit: Payer: 59 | Admitting: Family

## 2022-03-20 ENCOUNTER — Ambulatory Visit: Payer: 59 | Admitting: Family

## 2022-03-20 ENCOUNTER — Encounter: Payer: Self-pay | Admitting: Family

## 2022-03-20 ENCOUNTER — Other Ambulatory Visit: Payer: Self-pay

## 2022-03-20 VITALS — BP 110/78 | HR 73 | Temp 98.1°F | Ht 67.0 in | Wt 224.4 lb

## 2022-03-20 DIAGNOSIS — E669 Obesity, unspecified: Secondary | ICD-10-CM

## 2022-03-20 DIAGNOSIS — Z13220 Encounter for screening for lipoid disorders: Secondary | ICD-10-CM

## 2022-03-20 DIAGNOSIS — E039 Hypothyroidism, unspecified: Secondary | ICD-10-CM

## 2022-03-20 DIAGNOSIS — F419 Anxiety disorder, unspecified: Secondary | ICD-10-CM | POA: Diagnosis not present

## 2022-03-20 DIAGNOSIS — Z136 Encounter for screening for cardiovascular disorders: Secondary | ICD-10-CM | POA: Diagnosis not present

## 2022-03-20 DIAGNOSIS — Z8639 Personal history of other endocrine, nutritional and metabolic disease: Secondary | ICD-10-CM

## 2022-03-20 MED ORDER — SERTRALINE HCL 100 MG PO TABS
100.0000 mg | ORAL_TABLET | Freq: Every day | ORAL | 3 refills | Status: DC
  Filled 2022-03-20: qty 90, fill #0
  Filled 2022-05-29 – 2022-06-13 (×2): qty 90, 90d supply, fill #0
  Filled 2022-10-18: qty 90, 90d supply, fill #1
  Filled 2023-03-18: qty 90, 90d supply, fill #2

## 2022-03-20 MED ORDER — LEVOTHYROXINE SODIUM 50 MCG PO TABS
50.0000 ug | ORAL_TABLET | Freq: Every day | ORAL | 3 refills | Status: DC
  Filled 2022-03-20: qty 30, 30d supply, fill #0

## 2022-03-20 MED ORDER — WEGOVY 0.25 MG/0.5ML ~~LOC~~ SOAJ
0.2500 mg | SUBCUTANEOUS | 2 refills | Status: DC
  Filled 2022-03-20 – 2022-04-03 (×4): qty 2, 28d supply, fill #0

## 2022-03-20 NOTE — Assessment & Plan Note (Signed)
Discussed trial of Wegovy with patient.  Counseled on blackbox warning as it relates to multiple endocrine neoplasia, medullary thyroid cancer, side effects and administration.  We briefly discussed Wellbutrin however she would prefer starting with [facility].  We also discussed decreasing Zoloft and adding small dose of Wellbutrin to aid in weight loss and apathy.  Again, she prefers starting with [facility].  Close follow-up.

## 2022-03-20 NOTE — Patient Instructions (Signed)
We have discussed starting non insulin daily injectable medication called Wegovy  which is a glucagon like peptide (GLP 1) agonist and works by delaying gastric emptying and increasing insulin secretion.It is given once per week. Most patients see significant weight loss with this drug class.   You may NOT take either medication if you or your family has history of thyroid, parathyroid, OR adrenal cancer. Please confirm you and your family does NOT have this history as this drug class has black box warning on this medication for that reason.   Please follow  directions on prescription and slowly increase from 0.67m Faith once per week ;stay here for 4 weeks. You may then increase to 0.514msc once per week and stay there for 4 weeks.  We can slowly titrate further at follow up with goal of no more than 1-2 lbs weight loss per week.  [facility] ([facility] Dose (mg[facility] Once Weekly Titration:    If a dose is not tolerated, consider delaying further dose increases for  another 4 weeks.  If you are actively losing weight on a dose, do not increase medication.   Weeks 1 through 4  0.25 mg once weekly  Weeks 5 through 8  0.5 mg once weekly  Weeks 9 though 12  1 mg once weekly  Weeks 13 through 16  1.7 mg once weekly   Semaglutide Injection (Weight Management) What is this medication? SEMAGLUTIDE (SEM a GLOO tide) promotes weight loss. It may also be used to maintain weight loss. It works by decreasing appetite. Changes to diet and exercise are often combined with this medication. This medicine may be used for other purposes; ask your health care provider or pharmacist if you have questions. COMMON BRAND NAME(S): [id number] should I tell my care team before I take this medication? They need to know if you have any of these conditions: Endocrine tumors (MEN 2) or if someone in your family had these tumors Eye disease, vision problems Gallbladder disease History of depression or mental health  disease History of pancreatitis Kidney disease Stomach or intestine problems Suicidal thoughts, plans, or attempt; a previous suicide attempt by you or a family member Thyroid cancer or if someone in your family had thyroid cancer An unusual or allergic reaction to semaglutide, other medications, foods, dyes, or preservatives Pregnant or trying to get pregnant Breast-feeding How should I use this medication? This medication is injected under the skin. You will be taught how to prepare and give it. Take it as directed on the prescription label. It is given once every week (every 7 days). Keep taking it unless your care team tells you to stop. It is important that you put your used needles and pens in a special sharps container. Do not put them in a trash can. If you do not have a sharps container, call your pharmacist or care team to get one. A special MedGuide will be given to you by the pharmacist with each prescription and refill. Be sure to read this information carefully each time. This medication comes with INSTRUCTIONS FOR USE. Ask your pharmacist for directions on how to use this medication. Read the information carefully. Talk to your pharmacist or care team if you have questions. Talk to your care team about the use of this medication in children. While it may be prescribed for children as young as 12 years for selected conditions, precautions do apply. Overdosage: If you think you have taken too much of this medicine contact a  poison control center or emergency room at once. NOTE: This medicine is only for you. Do not share this medicine with others. What if I miss a dose? If you miss a dose and the next scheduled dose is more than 2 days away, take the missed dose as soon as possible. If you miss a dose and the next scheduled dose is less than 2 days away, do not take the missed dose. Take the next dose at your regular time. Do not take double or extra doses. If you miss your dose for 2  weeks or more, take the next dose at your regular time or call your care team to talk about how to restart this medication. What may interact with this medication? Insulin and other medications for diabetes This list may not describe all possible interactions. Give your health care provider a list of all the medicines, herbs, non-prescription drugs, or dietary supplements you use. Also tell them if you smoke, drink alcohol, or use illegal drugs. Some items may interact with your medicine. What should I watch for while using this medication? Visit your care team for regular checks on your progress. It may be some time before you see the benefit from this medication. Drink plenty of fluids while taking this medication. Check with your care team if you have severe diarrhea, nausea, and vomiting, or if you sweat a lot. The loss of too much body fluid may make it dangerous for you to take this medication. This medication may affect blood sugar levels. Ask your care team if changes in diet or medications are needed if you have diabetes. If you or your family notice any changes in your behavior, such as new or worsening depression, thoughts of harming yourself, anxiety, other unusual or disturbing thoughts, or memory loss, call your care team right away. Women should inform their care team if they wish to become pregnant or think they might be pregnant. Losing weight while pregnant is not advised and may cause harm to the unborn child. Talk to your care team for more information. What side effects may I notice from receiving this medication? Side effects that you should report to your care team as soon as possible: Allergic reactions--skin rash, itching, hives, swelling of the face, lips, tongue, or throat Change in vision Dehydration--increased thirst, dry mouth, feeling faint or lightheaded, headache, dark yellow or brown urine Gallbladder problems--severe stomach pain, nausea, vomiting, fever Heart  palpitations--rapid, pounding, or irregular heartbeat Kidney injury--decrease in the amount of urine, swelling of the ankles, hands, or feet Pancreatitis--severe stomach pain that spreads to your back or gets worse after eating or when touched, fever, nausea, vomiting Thoughts of suicide or self-harm, worsening mood, feelings of depression Thyroid cancer--new mass or lump in the neck, pain or trouble swallowing, trouble breathing, hoarseness Side effects that usually do not require medical attention (report to your care team if they continue or are bothersome): Diarrhea Loss of appetite Nausea Stomach pain Vomiting This list may not describe all possible side effects. Call your doctor for medical advice about side effects. You may report side effects to FDA at 1-800-FDA-1088. Where should I keep my medication? Keep out of the reach of children and pets. Refrigeration (preferred): Store in the refrigerator. Do not freeze. Keep this medication in the original container until you are ready to take it. Get rid of any unused medication after the expiration date. Room temperature: If needed, prior to cap removal, the pen can be stored at room temperature  for up to 28 days. Protect from light. If it is stored at room temperature, get rid of any unused medication after 28 days or after it expires, whichever is first. It is important to get rid of the medication as soon as you no longer need it or it is expired. You can do this in two ways: Take the medication to a medication take-back program. Check with your pharmacy or law enforcement to find a location. If you cannot return the medication, follow the directions in the [employer]. NOTE: This sheet is a summary. It may not cover all possible information. If you have questions about this medicine, talk to your doctor, pharmacist, or health care provider.  2023 Elsevier/Gold Standard (2020-04-01 00:00:00)

## 2022-03-20 NOTE — Assessment & Plan Note (Signed)
Chronic, suboptimal control.  Consider Wellbutrin adjunct at follow-up.  Continue Zoloft 100 mg.

## 2022-03-20 NOTE — Assessment & Plan Note (Signed)
Presumed stable.  Pending TSH.  Continue Synthroid 50 mcg for now.

## 2022-03-20 NOTE — Progress Notes (Signed)
Assessment & Plan:  Obesity (BMI 35.0-39.9 without comorbidity) Assessment & Plan: Discussed trial of Wegovy with patient.  Counseled on blackbox warning as it relates to multiple endocrine neoplasia, medullary thyroid cancer, side effects and administration.  We briefly discussed Wellbutrin however she would prefer starting with [facility].  We also discussed decreasing Zoloft and adding small dose of Wellbutrin to aid in weight loss and apathy.  Again, she prefers starting with [facility].  Close follow-up.  Orders: -     Wegovy; Inject 0.25 mg into the skin once a week.  Dispense: 2 mL; Refill: 2 -     Hemoglobin A1c; Future -     Comprehensive metabolic panel; Future  Acquired hypothyroidism Assessment & Plan: Presumed stable.  Pending TSH.  Continue Synthroid 50 mcg for now.  Orders: -     Levothyroxine Sodium; Take 1 tablet (50 mcg total) by mouth daily.  Dispense: 90 tablet; Refill: 3 -     TSH; Future  Anxiety Assessment & Plan: Chronic, suboptimal control.  Consider Wellbutrin adjunct at follow-up.  Continue Zoloft 100 mg.  Orders: -     Sertraline HCl; Take 1 tablet (100 mg total) by mouth daily.  Dispense: 90 tablet; Refill: 3  History of vitamin D deficiency -     VITAMIN D 25 Hydroxy (Vit-D Deficiency, Fractures); Future  Encounter for lipid screening for cardiovascular disease -     Lipid panel; Future     Return precautions given.   Risks, benefits, and alternatives of the medications and treatment plan prescribed today were discussed, and patient expressed understanding.   Education regarding symptom management and diagnosis given to patient on AVS either electronically or printed.  Return for Complete Physical Exam, Fasting labs in 2-3 weeks.  Mable Paris, FNP  Subjective:    Patient ID: Colleen Rodriguez, female    DOB: 05-11-1975, 47 y.o.   MRN: [id number]  CC: Colleen Rodriguez is a 47 y.o. female who presents today for follow up.   HPI: Reestablish  care, last seen 03/2020  Feels well today.  Most frustrated about weight gain.  She describes apathy,  decreased motivation as it relates to making better food choices , exercise.  She the gym however has not been going to the gym.  No routine exercise.  Endorses snacking indiscretion.  No h/o thyroid surgery, XRT or h/o thyroid nodules.  She denies compressive symptoms  No personal or family h/o thyroid cancer, multiple endocrine neoplasia  She is compliant with Zoloft 100 mg;  overall feels has been helpful.  At times she wonders if she feels more flat on medication. No depression.     She is compliant with Synthroid 50 mg MCG.  No history of thyroid nodule, thyroid cancer, XRT.  Denies compressive symptoms.  No personal or family history of thyroid cancer, multiple endocrine neoplasia  Allergies: Patient has no known allergies. Current Outpatient Medications on File Prior to Visit  Medication Sig Dispense Refill   norethindrone-ethinyl estradiol-FE (LARIN FE 1/20) 1-20 MG-MCG tablet TAKE 1 TABLET BY MOUTH DAILY. 28 tablet 11   valACYclovir (VALTREX) 1000 MG tablet TAKE 2 TABLETS BY MOUTH EVERY 12 HOURS FOR 1 DAY. START AS SOON AS POSSIBLE AFTER SYMPTOM ONSET 15 tablet 2   No current facility-administered medications on file prior to visit.    Review of Systems  Constitutional:  Negative for chills and fever.  Respiratory:  Negative for cough.   Cardiovascular:  Negative for chest pain and palpitations.  Gastrointestinal:  Negative for nausea and vomiting.      Objective:    BP 110/78   Pulse 73   Temp 98.1 F (36.7 C) (Oral)   Ht 5' 7"$  (1.702 m)   Wt 224 lb 6.4 oz (101.8 kg)   LMP  (LMP Unknown)   SpO2 97%   BMI 35.15 kg/m  BP Readings from Last 3 Encounters:  03/20/22 110/78  03/26/20 102/68  09/13/18 100/62   Wt Readings from Last 3 Encounters:  03/20/22 224 lb 6.4 oz (101.8 kg)  03/26/20 199 lb 12.8 oz (90.6 kg)  01/15/19 173 lb (78.5 kg)    Physical  Exam Vitals reviewed.  Constitutional:      Appearance: She is well-developed.  Eyes:     Conjunctiva/sclera: Conjunctivae normal.  Neck:     Thyroid: No thyroid mass, thyromegaly or thyroid tenderness.  Cardiovascular:     Rate and Rhythm: Normal rate and regular rhythm.     Pulses: Normal pulses.     Heart sounds: Normal heart sounds.  Pulmonary:     Effort: Pulmonary effort is normal.     Breath sounds: Normal breath sounds. No wheezing, rhonchi or rales.  Skin:    General: Skin is warm and dry.  Neurological:     Mental Status: She is alert.  Psychiatric:        Speech: Speech normal.        Behavior: Behavior normal.        Thought Content: Thought content normal.

## 2022-03-27 ENCOUNTER — Other Ambulatory Visit (HOSPITAL_COMMUNITY): Payer: Self-pay

## 2022-03-27 ENCOUNTER — Other Ambulatory Visit: Payer: Self-pay

## 2022-03-27 ENCOUNTER — Telehealth: Payer: Self-pay | Admitting: Family

## 2022-03-27 NOTE — Telephone Encounter (Signed)
Pt calling for a PA for wegovy

## 2022-03-27 NOTE — Telephone Encounter (Signed)
Spoke to pt to inform her that PA for [facility] has been submitted

## 2022-03-27 NOTE — Telephone Encounter (Signed)
Pharmacy Patient Advocate Encounter   Received notification that prior authorization for Wegovy 0.'25mg'$ /0.68m is required/requested.  Per Test Claim: Prior authorization required   PA submitted on 03/27/22 to (ins) [employer]  via [facility]eds [facility]

## 2022-03-29 NOTE — Telephone Encounter (Signed)
Pt advised.

## 2022-03-30 ENCOUNTER — Other Ambulatory Visit: Payer: Self-pay

## 2022-04-03 ENCOUNTER — Other Ambulatory Visit: Payer: Self-pay

## 2022-04-04 ENCOUNTER — Other Ambulatory Visit: Payer: Self-pay

## 2022-04-05 ENCOUNTER — Other Ambulatory Visit (INDEPENDENT_AMBULATORY_CARE_PROVIDER_SITE_OTHER): Payer: 59

## 2022-04-05 DIAGNOSIS — Z8639 Personal history of other endocrine, nutritional and metabolic disease: Secondary | ICD-10-CM

## 2022-04-05 DIAGNOSIS — Z136 Encounter for screening for cardiovascular disorders: Secondary | ICD-10-CM | POA: Diagnosis not present

## 2022-04-05 DIAGNOSIS — E669 Obesity, unspecified: Secondary | ICD-10-CM | POA: Diagnosis not present

## 2022-04-05 DIAGNOSIS — E039 Hypothyroidism, unspecified: Secondary | ICD-10-CM

## 2022-04-05 DIAGNOSIS — Z13220 Encounter for screening for lipoid disorders: Secondary | ICD-10-CM | POA: Diagnosis not present

## 2022-04-05 LAB — TSH: TSH: 4.35 u[IU]/mL (ref 0.35–5.50)

## 2022-04-05 LAB — COMPREHENSIVE METABOLIC PANEL
ALT: 18 U/L (ref 0–35)
AST: 12 U/L (ref 0–37)
Albumin: 3.6 g/dL (ref 3.5–5.2)
Alkaline Phosphatase: 60 U/L (ref 39–117)
BUN: 9 mg/dL (ref 6–23)
CO2: 25 mEq/L (ref 19–32)
Calcium: 8.9 mg/dL (ref 8.4–10.5)
Chloride: 104 mEq/L (ref 96–112)
Creatinine, Ser: 0.65 mg/dL (ref 0.40–1.20)
GFR: 105.87 mL/min (ref >60.00)
Glucose, Bld: 96 mg/dL (ref 70–99)
Potassium: 4.1 mEq/L (ref 3.5–5.1)
Sodium: 139 mEq/L (ref 135–145)
Total Bilirubin: 0.5 mg/dL (ref 0.2–1.2)
Total Protein: 6.1 g/dL (ref 6.0–8.3)

## 2022-04-05 LAB — LIPID PANEL
Cholesterol: 204 mg/dL — ABNORMAL HIGH (ref 0–200)
HDL: 65 mg/dL (ref >39.00)
LDL Cholesterol: 121 mg/dL — ABNORMAL HIGH (ref 0–99)
NonHDL: 138.68
Total CHOL/HDL Ratio: 3
Triglycerides: 90 mg/dL (ref 0.0–149.0)
VLDL: 18 mg/dL (ref 0.0–40.0)

## 2022-04-05 LAB — VITAMIN D 25 HYDROXY (VIT D DEFICIENCY, FRACTURES): VITD: 22.41 ng/mL — ABNORMAL LOW (ref 30.00–100.00)

## 2022-04-05 LAB — HEMOGLOBIN A1C: Hgb A1c MFr Bld: 5.6 % (ref 4.6–6.5)

## 2022-04-18 ENCOUNTER — Telehealth: Payer: Self-pay

## 2022-04-18 NOTE — Telephone Encounter (Signed)
LVM   to call back to go over results 

## 2022-04-20 ENCOUNTER — Telehealth: Payer: Self-pay

## 2022-04-20 NOTE — Telephone Encounter (Signed)
Pt returned Jenate CMA call. Transferred. 

## 2022-04-20 NOTE — Telephone Encounter (Signed)
LVM to call back to office to go over results 

## 2022-04-24 ENCOUNTER — Encounter: Payer: 59 | Admitting: Family

## 2022-05-01 ENCOUNTER — Other Ambulatory Visit: Payer: Self-pay

## 2022-05-01 MED FILL — Norethindrone Ace & Ethinyl Estradiol-FE Tab 1 MG-20 MCG: ORAL | 84 days supply | Qty: 84 | Fill #1 | Status: AC

## 2022-05-25 ENCOUNTER — Encounter: Payer: 59 | Admitting: Family

## 2022-05-29 MED FILL — Valacyclovir HCl Tab 1 GM: ORAL | 7 days supply | Qty: 15 | Fill #1 | Status: CN

## 2022-05-30 ENCOUNTER — Other Ambulatory Visit: Payer: Self-pay

## 2022-06-13 ENCOUNTER — Other Ambulatory Visit: Payer: Self-pay

## 2022-06-13 MED FILL — Valacyclovir HCl Tab 1 GM: ORAL | 7 days supply | Qty: 15 | Fill #1 | Status: AC

## 2022-06-16 ENCOUNTER — Telehealth: Payer: 59 | Admitting: Family Medicine

## 2022-06-16 ENCOUNTER — Encounter: Payer: 59 | Admitting: Family

## 2022-06-16 DIAGNOSIS — J0100 Acute maxillary sinusitis, unspecified: Secondary | ICD-10-CM | POA: Diagnosis not present

## 2022-06-16 MED ORDER — AMOXICILLIN-POT CLAVULANATE 875-125 MG PO TABS: 1.0000 | ORAL_TABLET | Freq: Two times a day (BID) | ORAL | 0 refills | Status: DC

## 2022-06-16 NOTE — Progress Notes (Signed)

## 2022-07-24 ENCOUNTER — Other Ambulatory Visit: Payer: Self-pay

## 2022-07-24 MED FILL — Norethindrone Ace & Ethinyl Estradiol-FE Tab 1 MG-20 MCG: ORAL | 84 days supply | Qty: 84 | Fill #2 | Status: AC

## 2022-07-24 MED FILL — Norethindrone Ace & Ethinyl Estradiol-FE Tab 1 MG-20 MCG: ORAL | 84 days supply | Qty: 84 | Fill #2 | Status: CN

## 2022-08-01 ENCOUNTER — Other Ambulatory Visit (HOSPITAL_COMMUNITY)
Admission: RE | Admit: 2022-08-01 | Discharge: 2022-08-01 | Disposition: A | Discharge status: Home / Self Care | Payer: 59 | Source: Ambulatory Visit | Attending: Family | Admitting: Family

## 2022-08-01 ENCOUNTER — Ambulatory Visit (INDEPENDENT_AMBULATORY_CARE_PROVIDER_SITE_OTHER): Payer: 59 | Admitting: Family

## 2022-08-01 ENCOUNTER — Encounter: Payer: Self-pay | Admitting: Family

## 2022-08-01 VITALS — BP 118/78 | HR 85 | Temp 98.1°F | Ht 67.0 in | Wt 198.4 lb

## 2022-08-01 DIAGNOSIS — Z124 Encounter for screening for malignant neoplasm of cervix: Secondary | ICD-10-CM | POA: Diagnosis not present

## 2022-08-01 DIAGNOSIS — Z0000 Encounter for general adult medical examination without abnormal findings: Secondary | ICD-10-CM

## 2022-08-01 DIAGNOSIS — Z8639 Personal history of other endocrine, nutritional and metabolic disease: Secondary | ICD-10-CM

## 2022-08-01 DIAGNOSIS — R928 Other abnormal and inconclusive findings on diagnostic imaging of breast: Secondary | ICD-10-CM

## 2022-08-01 DIAGNOSIS — D509 Iron deficiency anemia, unspecified: Secondary | ICD-10-CM | POA: Diagnosis not present

## 2022-08-01 DIAGNOSIS — Z1211 Encounter for screening for malignant neoplasm of colon: Secondary | ICD-10-CM

## 2022-08-01 LAB — CBC WITH DIFFERENTIAL/PLATELET
Basophils Absolute: 0 10*3/uL (ref 0.0–0.1)
Basophils Relative: 0.4 % (ref 0.0–3.0)
Eosinophils Absolute: 0.2 10*3/uL (ref 0.0–0.7)
Eosinophils Relative: 1.8 % (ref 0.0–5.0)
HCT: 41.8 % (ref 36.0–46.0)
Hemoglobin: 14 g/dL (ref 12.0–15.0)
Lymphocytes Relative: 24.6 % (ref 12.0–46.0)
Lymphs Abs: 2.3 10*3/uL (ref 0.7–4.0)
MCHC: 33.4 g/dL (ref 30.0–36.0)
MCV: 95.4 fl (ref 78.0–100.0)
Monocytes Absolute: 0.6 10*3/uL (ref 0.1–1.0)
Monocytes Relative: 5.8 % (ref 3.0–12.0)
Neutro Abs: 6.4 10*3/uL (ref 1.4–7.7)
Neutrophils Relative %: 67.4 % (ref 43.0–77.0)
Platelets: 328 10*3/uL (ref 150.0–400.0)
RBC: 4.38 Mil/uL (ref 3.87–5.11)
RDW: 13.5 % (ref 11.5–15.5)
WBC: 9.5 10*3/uL (ref 4.0–10.5)

## 2022-08-01 LAB — VITAMIN D 25 HYDROXY (VIT D DEFICIENCY, FRACTURES): VITD: 32.62 ng/mL (ref 30.00–100.00)

## 2022-08-01 NOTE — Progress Notes (Signed)
Assessment & Plan:  Annual physical exam Assessment & Plan: Clinical breast exam performed today.  Pap smear obtained.  Encouraged continued exercise.  Referral for colonoscopy   Screening for cervical cancer -     Cytology - PAP  Screening for colon cancer -     Ambulatory referral to Gastroenterology  Abnormal mammogram -     MM 3D DIAGNOSTIC MAMMOGRAM BILATERAL BREAST; Future -     [place] LIMITED ULTRASOUND INCLUDING AXILLA LEFT BREAST ; Future  Iron deficiency anemia, unspecified iron deficiency anemia type -     CBC with Differential/Platelet -     Iron, TIBC and Ferritin Panel  History of vitamin D deficiency -     VITAMIN D 25 Hydroxy (Vit-D Deficiency, Fractures)     Return precautions given.   Risks, benefits, and alternatives of the medications and treatment plan prescribed today were discussed, and patient expressed understanding.   Education regarding symptom management and diagnosis given to patient on AVS either electronically or printed.  No follow-ups on file.  Rennie Plowman, FNP  Subjective:    Patient ID: Colleen Rodriguez, female    DOB: [birth year], 47 y.o.   MRN: [id number]  CC: Colleen Rodriguez is a 47 y.o. female who presents today for physical exam.    HPI: Feels well today.  No new complaints. History of anemia and she would like iron stores rechecked.  She is currently on iron supplement    Colorectal Cancer Screening: due Breast Cancer Screening: Mammogram due Cervical Cancer Screening: due; negative malignancy 09/13/2018; h/o CIN3 s/p LEEP Bone Health screening/DEXA for 65+: No increased fracture risk. Defer screening at this time       Tetanus - UTD         Exercise: Gets regular exercise, walking   Alcohol use: Occasional Smoking/tobacco use: former smoker.    Health Maintenance  Topic Date Due   Colon Cancer Screening  Never done   Pap Smear  09/12/2021   COVID-19 Vaccine (4 - 2023-24 season) 09/30/2021   Flu Shot  08/31/2022    DTaP/Tdap/Td vaccine (3 - Td or Tdap) 03/26/2030   Hepatitis C Screening  Completed   HIV Screening  Addressed   HPV Vaccine  Aged Out    ALLERGIES: Patient has no known allergies.  Current Outpatient Medications on File Prior to Visit  Medication Sig Dispense Refill   Cholecalciferol (VITAMIN D3) 250 MCG (10000 UT) capsule Take 10,000 Units by mouth daily.     ferrous sulfate ER (SLOW FE) 142 (45 Fe) MG TBCR tablet Take 1 tablet by mouth daily.     norethindrone-ethinyl estradiol-FE (LARIN FE 1/20) 1-20 MG-MCG tablet TAKE 1 TABLET BY MOUTH DAILY. 28 tablet 11   sertraline (ZOLOFT) 100 MG tablet Take 1 tablet (100 mg total) by mouth daily. 90 tablet 3   valACYclovir (VALTREX) 1000 MG tablet TAKE 2 TABLETS BY MOUTH EVERY 12 HOURS FOR 1 DAY. START AS SOON AS POSSIBLE AFTER SYMPTOM ONSET 15 tablet 2   No current facility-administered medications on file prior to visit.    Review of Systems  Constitutional:  Negative for chills, fever and unexpected weight change.  HENT:  Negative for congestion.   Respiratory:  Negative for cough.   Cardiovascular:  Negative for chest pain, palpitations and leg swelling.  Gastrointestinal:  Negative for nausea and vomiting.  Genitourinary:  Negative for vaginal pain.  Musculoskeletal:  Negative for arthralgias and myalgias.  Skin:  Negative for rash.  Neurological:  Negative for headaches.  Hematological:  Negative for adenopathy.  Psychiatric/Behavioral:  Negative for confusion.       Objective:    BP 118/78   Pulse 85   Temp 98.1 F (36.7 C) (Oral)   Ht 5\' 7"  (1.702 m)   Wt 198 lb 6.4 oz (90 kg)   SpO2 98%   BMI 31.07 kg/m   BP Readings from Last 3 Encounters:  08/01/22 118/78  03/20/22 110/78  03/26/20 102/68   Wt Readings from Last 3 Encounters:  08/01/22 198 lb 6.4 oz (90 kg)  03/20/22 224 lb 6.4 oz (101.8 kg)  03/26/20 199 lb 12.8 oz (90.6 kg)    Physical Exam Vitals reviewed.  Constitutional:      Appearance: Normal  appearance. She is well-developed.  Eyes:     Conjunctiva/sclera: Conjunctivae normal.  Neck:     Thyroid: No thyroid mass or thyromegaly.  Cardiovascular:     Rate and Rhythm: Normal rate and regular rhythm.     Pulses: Normal pulses.     Heart sounds: Normal heart sounds.  Pulmonary:     Effort: Pulmonary effort is normal.     Breath sounds: Normal breath sounds. No wheezing, rhonchi or rales.  Chest:  Breasts:    Breasts are symmetrical.     Right: No inverted nipple, mass, nipple discharge, skin change or tenderness.     Left: No inverted nipple, mass, nipple discharge, skin change or tenderness.  Abdominal:     General: Bowel sounds are normal. There is no distension.     Palpations: Abdomen is soft. Abdomen is not rigid. There is no fluid wave or mass.     Tenderness: There is no abdominal tenderness. There is no guarding or rebound.  Genitourinary:    Cervix: No cervical motion tenderness, discharge or friability.     Uterus: Not enlarged, not fixed and not tender.      Adnexa:        Right: No mass, tenderness or fullness.         Left: No mass, tenderness or fullness.       Comments: Pap performed. No CMT. Unable to appreciated ovaries. Lymphadenopathy:     Head:     Right side of head: No submental, submandibular, tonsillar, preauricular, posterior auricular or occipital adenopathy.     Left side of head: No submental, submandibular, tonsillar, preauricular, posterior auricular or occipital adenopathy.     Cervical: No cervical adenopathy.     Right cervical: No superficial, deep or posterior cervical adenopathy.    Left cervical: No superficial, deep or posterior cervical adenopathy.     Upper Body:     Right upper body: No pectoral adenopathy.     Left upper body: No pectoral adenopathy.  Skin:    General: Skin is warm and dry.  Neurological:     Mental Status: She is alert.  Psychiatric:        Speech: Speech normal.        Behavior: Behavior normal.         Thought Content: Thought content normal.

## 2022-08-01 NOTE — Patient Instructions (Addendum)
Please call  and schedule your 3D mammogram and /or bone density scan as we discussed.   [facility]  ( new location in 2023)  [address], [place], [place] [address]  [place], [place]  [phone]  Referral placed for colonoscopy  Let us know if you dont hear back within a week in regards to an appointment being scheduled.   So that you are aware, if you are [facility] MyChart user , please pay attention to your MyChart messages as you may receive a MyChart message with a phone number to call and schedule this test/appointment own your own from our referral coordinator. This is a new process so I do not want you to miss this message.  If you are not a MyChart user, you will receive a phone call.   Health Maintenance, Female Adopting a healthy lifestyle and getting preventive care are important in promoting health and wellness. Ask your health care provider about: The right schedule for you to have regular tests and exams. Things you can do on your own to prevent diseases and keep yourself healthy. What should I know about diet, weight, and exercise? Eat a healthy diet  Eat a diet that includes plenty of vegetables, fruits, low-fat dairy products, and lean protein. Do not eat a lot of foods that are high in solid fats, added sugars, or sodium. Maintain a healthy weight Body mass index (BMI) is used to identify weight problems. It estimates body fat based on height and weight. Your health care provider can help determine your BMI and help you achieve or maintain a healthy weight. Get regular exercise Get regular exercise. This is one of the most important things you can do for your health. Most adults should: Exercise for at least 150 minutes each week. The exercise should increase your heart rate and make you sweat (moderate-intensity exercise). Do strengthening exercises at least twice a week. This is in addition to the moderate-intensity exercise. Spend less time  sitting. Even light physical activity can be beneficial. Watch cholesterol and blood lipids Have your blood tested for lipids and cholesterol at 46 years of age, then have this test every 5 years. Have your cholesterol levels checked more often if: Your lipid or cholesterol levels are high. You are older than 46 years of age. You are at high risk for heart disease. What should I know about cancer screening? Depending on your health history and family history, you may need to have cancer screening at various ages. This may include screening for: Breast cancer. Cervical cancer. Colorectal cancer. Skin cancer. Lung cancer. What should I know about heart disease, diabetes, and high blood pressure? Blood pressure and heart disease High blood pressure causes heart disease and increases the risk of stroke. This is more likely to develop in people who have high blood pressure readings or are overweight. Have your blood pressure checked: Every 3-5 years if you are 46-68 years of age. Every year if you are 58 years old or older. Diabetes Have regular diabetes screenings. This checks your fasting blood sugar level. Have the screening done: Once every three years after age 75 if you are at a normal weight and have a low risk for diabetes. More often and at a younger age if you are overweight or have a high risk for diabetes. What should I know about preventing infection? Hepatitis B If you have a higher risk for hepatitis B, you should be screened for this virus. Talk with your  health care provider to find out if you are at risk for hepatitis B infection. Hepatitis C Testing is recommended for: Everyone born from [birth year] through 1964. Anyone with known risk factors for hepatitis C. Sexually transmitted infections (STIs) Get screened for STIs, including gonorrhea and chlamydia, if: You are sexually active and are younger than 46 years of age. You are older than 46 years of age and your health care  provider tells you that you are at risk for this type of infection. Your sexual activity has changed since you were last screened, and you are at increased risk for chlamydia or gonorrhea. Ask your health care provider if you are at risk. Ask your health care provider about whether you are at high risk for HIV. Your health care provider may recommend a prescription medicine to help prevent HIV infection. If you choose to take medicine to prevent HIV, you should first get tested for HIV. You should then be tested every 3 months for as long as you are taking the medicine. Pregnancy If you are about to stop having your period (premenopausal) and you may become pregnant, seek counseling before you get pregnant. Take 400 to 800 micrograms (mcg) of folic acid every day if you become pregnant. Ask for birth control (contraception) if you want to prevent pregnancy. Osteoporosis and menopause Osteoporosis is a disease in which the bones lose minerals and strength with aging. This can result in bone fractures. If you are 41 years old or older, or if you are at risk for osteoporosis and fractures, ask your health care provider if you should: Be screened for bone loss. Take a calcium or vitamin D supplement to lower your risk of fractures. Be given hormone replacement therapy (HRT) to treat symptoms of menopause. Follow these instructions at home: Alcohol use Do not drink alcohol if: Your health care provider tells you not to drink. You are pregnant, may be pregnant, or are planning to become pregnant. If you drink alcohol: Limit how much you have to: 0-1 drink a day. Know how much alcohol is in your drink. In the [place] one drink equals one 12 oz bottle of beer (355 mL), one 5 oz glass of wine (148 mL), or one 1 oz glass of hard liquor (44 mL). Lifestyle Do not use any products that contain nicotine or tobacco. These products include cigarettes, chewing tobacco, and vaping devices, such as e-cigarettes.  If you need help quitting, ask your health care provider. Do not use street drugs. Do not share needles. Ask your health care provider for help if you need support or information about quitting drugs. General instructions Schedule regular health, dental, and eye exams. Stay current with your vaccines. Tell your health care provider if: You often feel depressed. You have ever been abused or do not feel safe at home. Summary Adopting a healthy lifestyle and getting preventive care are important in promoting health and wellness. Follow your health care provider's instructions about healthy diet, exercising, and getting tested or screened for diseases. Follow your health care provider's instructions on monitoring your cholesterol and blood pressure. This information is not intended to replace advice given to you by your health care provider. Make sure you discuss any questions you have with your health care provider. Document Revised: 06/07/2020 Document Reviewed: 06/07/2020 Elsevier Patient Education  2024 [employer].

## 2022-08-01 NOTE — Assessment & Plan Note (Signed)
Clinical breast exam performed today.  Pap smear obtained.  Encouraged continued exercise.  Referral for colonoscopy

## 2022-08-02 LAB — IRON,TIBC AND FERRITIN PANEL
%SAT: 12 % (calc) — ABNORMAL LOW (ref 16–45)
Ferritin: 21 ng/mL (ref 16–232)
Iron: 64 ug/dL (ref 40–190)
TIBC: 521 mcg/dL (calc) — ABNORMAL HIGH (ref 250–450)

## 2022-08-02 LAB — CYTOLOGY - PAP
Comment: NEGATIVE
Diagnosis: UNDETERMINED — AB
High risk HPV: NEGATIVE

## 2022-08-07 ENCOUNTER — Other Ambulatory Visit: Payer: Self-pay

## 2022-08-07 ENCOUNTER — Telehealth: Payer: Self-pay | Admitting: *Deleted

## 2022-08-07 ENCOUNTER — Other Ambulatory Visit: Payer: Self-pay | Admitting: *Deleted

## 2022-08-07 DIAGNOSIS — Z1211 Encounter for screening for malignant neoplasm of colon: Secondary | ICD-10-CM

## 2022-08-07 MED ORDER — NA SULFATE-K SULFATE-MG SULF 17.5-3.13-1.6 GM/177ML PO SOLN
1.0000 | Freq: Once | ORAL | 0 refills | Status: AC
  Filled 2022-08-07 – 2022-09-14 (×2): qty 354, 1d supply, fill #0

## 2022-08-07 NOTE — Telephone Encounter (Signed)
Gastroenterology Pre-Procedure Review  Request Date: 09/22/2022 Requesting Physician: Dr. 09/22/2022  PATIENT REVIEW QUESTIONS: The patient responded to the following health history questions as indicated:    1. Are you having any GI issues? no 2. Do you have a personal history of Polyps? no 3. Do you have a family history of Colon Cancer or Polyps? no 4. Diabetes Mellitus? no 5. Joint replacements in the past 12 months?no 6. Major health problems in the past 3 months?no 7. Any artificial heart valves, MVP, or defibrillator?no    MEDICATIONS & ALLERGIES:    Patient reports the following regarding taking any anticoagulation/antiplatelet therapy:   Plavix, Coumadin, Eliquis, Xarelto, Lovenox, Pradaxa, Brilinta, or Effient? no Aspirin? no  Patient confirms/reports the following medications:  Current Outpatient Medications  Medication Sig Dispense Refill   Cholecalciferol (VITAMIN D3) 250 MCG (10000 UT) capsule Take 10,000 Units by mouth daily.     ferrous sulfate ER (SLOW FE) 142 (45 Fe) MG TBCR tablet Take 1 tablet by mouth daily.     norethindrone-ethinyl estradiol-FE (LARIN FE 1/20) 1-20 MG-MCG tablet TAKE 1 TABLET BY MOUTH DAILY. 28 tablet 11   sertraline (ZOLOFT) 100 MG tablet Take 1 tablet (100 mg total) by mouth daily. 90 tablet 3   valACYclovir (VALTREX) 1000 MG tablet TAKE 2 TABLETS BY MOUTH EVERY 12 HOURS FOR 1 DAY. START AS SOON AS POSSIBLE AFTER SYMPTOM ONSET 15 tablet 2   No current facility-administered medications for this visit.    Patient confirms/reports the following allergies:  No Known Allergies  No orders of the defined types were placed in this encounter.   AUTHORIZATION INFORMATION Primary Insurance: 1D#: Group #:  Secondary Insurance: 1D#: Group #:  SCHEDULE INFORMATION: Date: 09/22/2022 Time: Location:  [facility]

## 2022-08-08 ENCOUNTER — Encounter: Payer: Self-pay | Admitting: Family

## 2022-08-09 ENCOUNTER — Other Ambulatory Visit: Payer: Self-pay

## 2022-08-09 DIAGNOSIS — R87629 Unspecified abnormal cytological findings in specimens from vagina: Secondary | ICD-10-CM

## 2022-08-09 NOTE — Progress Notes (Signed)
Orders only

## 2022-08-18 ENCOUNTER — Other Ambulatory Visit: Payer: 59

## 2022-08-18 ENCOUNTER — Other Ambulatory Visit: Payer: Self-pay

## 2022-09-12 ENCOUNTER — Ambulatory Visit
Admission: RE | Admit: 2022-09-12 | Discharge: 2022-09-12 | Disposition: A | Discharge status: Home / Self Care | Payer: 59 | Source: Ambulatory Visit | Attending: Family | Admitting: Family

## 2022-09-12 DIAGNOSIS — R928 Other abnormal and inconclusive findings on diagnostic imaging of breast: Secondary | ICD-10-CM | POA: Insufficient documentation

## 2022-09-12 DIAGNOSIS — N6321 Unspecified lump in the left breast, upper outer quadrant: Secondary | ICD-10-CM | POA: Diagnosis not present

## 2022-09-12 DIAGNOSIS — N6312 Unspecified lump in the right breast, upper inner quadrant: Secondary | ICD-10-CM | POA: Diagnosis not present

## 2022-09-12 DIAGNOSIS — R92333 Mammographic heterogeneous density, bilateral breasts: Secondary | ICD-10-CM | POA: Diagnosis not present

## 2022-09-13 ENCOUNTER — Encounter: Payer: 59 | Admitting: Obstetrics and Gynecology

## 2022-09-14 ENCOUNTER — Other Ambulatory Visit: Payer: Self-pay

## 2022-09-14 ENCOUNTER — Encounter: Payer: Self-pay | Admitting: Gastroenterology

## 2022-09-14 NOTE — Anesthesia Preprocedure Evaluation (Addendum)
Anesthesia Evaluation  Patient identified by MRN, date of birth, ID band Patient awake    Reviewed: Allergy & Precautions, H&P , NPO status , Patient's Chart, lab work & pertinent test results  Airway Mallampati: III  TM Distance: <3 FB Neck ROM: Full    Dental no notable dental hx.    Pulmonary former smoker   Pulmonary exam normal breath sounds clear to auscultation       Cardiovascular negative cardio ROS Normal cardiovascular exam Rhythm:Regular Rate:Normal     Neuro/Psych   Anxiety     negative neurological ROS  negative psych ROS   GI/Hepatic negative GI ROS, Neg liver ROS,,,  Endo/Other  negative endocrine ROSHypothyroidism    Renal/GU negative Renal ROS  negative genitourinary   Musculoskeletal negative musculoskeletal ROS (+)    Abdominal   Peds negative pediatric ROS (+)  Hematology negative hematology ROS (+) Blood dyscrasia, anemia   Anesthesia Other Findings Anxiety obesity  Reproductive/Obstetrics negative OB ROS                              Anesthesia Physical Anesthesia Plan  ASA: 2  Anesthesia Plan: General   Post-op Pain Management:    Induction: Intravenous  PONV Risk Score and Plan:   Airway Management Planned: Natural Airway and Nasal Cannula  Additional Equipment:   Intra-op Plan:   Post-operative Plan:   Informed Consent: I have reviewed the patients History and Physical, chart, labs and discussed the procedure including the risks, benefits and alternatives for the proposed anesthesia with the patient or authorized representative who has indicated his/her understanding and acceptance.     Dental Advisory Given  Plan Discussed with: Anesthesiologist, CRNA and Surgeon  Anesthesia Plan Comments: (Patient consented for risks of anesthesia including but not limited to:  - adverse reactions to medications - risk of airway placement if required -  damage to eyes, teeth, lips or other oral mucosa - nerve damage due to positioning  - sore throat or hoarseness - Damage to heart, brain, nerves, lungs, other parts of body or loss of life  Patient voiced understanding.)        Anesthesia Quick Evaluation

## 2022-09-19 ENCOUNTER — Other Ambulatory Visit: Payer: Self-pay

## 2022-09-22 ENCOUNTER — Other Ambulatory Visit: Payer: Self-pay

## 2022-09-22 ENCOUNTER — Encounter: Payer: Self-pay | Admitting: Gastroenterology

## 2022-09-22 ENCOUNTER — Ambulatory Visit: Payer: 59 | Admitting: Anesthesiology

## 2022-09-22 ENCOUNTER — Ambulatory Visit
Admission: RE | Admit: 2022-09-22 | Discharge: 2022-09-22 | Disposition: A | Discharge status: Home / Self Care | Payer: 59 | Source: Ambulatory Visit | Attending: Gastroenterology | Admitting: Gastroenterology

## 2022-09-22 ENCOUNTER — Encounter
Admission: RE | Disposition: A | Discharge status: Home / Self Care | Payer: Self-pay | Source: Ambulatory Visit | Attending: Gastroenterology

## 2022-09-22 DIAGNOSIS — E669 Obesity, unspecified: Secondary | ICD-10-CM | POA: Insufficient documentation

## 2022-09-22 DIAGNOSIS — D123 Benign neoplasm of transverse colon: Secondary | ICD-10-CM | POA: Diagnosis not present

## 2022-09-22 DIAGNOSIS — K635 Polyp of colon: Secondary | ICD-10-CM

## 2022-09-22 DIAGNOSIS — F419 Anxiety disorder, unspecified: Secondary | ICD-10-CM | POA: Insufficient documentation

## 2022-09-22 DIAGNOSIS — Z6829 Body mass index (BMI) 29.0-29.9, adult: Secondary | ICD-10-CM | POA: Diagnosis not present

## 2022-09-22 DIAGNOSIS — Z1211 Encounter for screening for malignant neoplasm of colon: Secondary | ICD-10-CM | POA: Diagnosis not present

## 2022-09-22 DIAGNOSIS — Z87891 Personal history of nicotine dependence: Secondary | ICD-10-CM | POA: Diagnosis not present

## 2022-09-22 HISTORY — PX: POLYPECTOMY: SHX5525

## 2022-09-22 HISTORY — PX: COLONOSCOPY WITH PROPOFOL: SHX5780

## 2022-09-22 LAB — POCT PREGNANCY, URINE: Preg Test, Ur: NEGATIVE

## 2022-09-22 SURGERY — COLONOSCOPY WITH PROPOFOL
Anesthesia: General

## 2022-09-22 MED ORDER — STERILE WATER FOR IRRIGATION IR SOLN
Status: DC | PRN
  Administered 2022-09-22: 1

## 2022-09-22 MED ORDER — PROPOFOL 10 MG/ML IV BOLUS
INTRAVENOUS | Status: DC | PRN
  Administered 2022-09-22 (×6): 20 mg via INTRAVENOUS
  Administered 2022-09-22 (×3): 50 mg via INTRAVENOUS

## 2022-09-22 MED ORDER — LACTATED RINGERS IV SOLN: INTRAVENOUS | Status: DC

## 2022-09-22 MED ORDER — LIDOCAINE HCL (CARDIAC) PF 100 MG/5ML IV SOSY
PREFILLED_SYRINGE | INTRAVENOUS | Status: DC | PRN
Start: 2022-09-22 — End: 2022-09-22
  Administered 2022-09-22: 40 mg via INTRATRACHEAL

## 2022-09-22 MED ORDER — STERILE WATER FOR IRRIGATION IR SOLN
Status: DC | PRN
  Administered 2022-09-22: 60 mL

## 2022-09-22 SURGICAL SUPPLY — 10 items
ELECT REM PT RETURN 9FT ADLT (ELECTROSURGICAL)
ELECTRODE REM PT RTRN 9FT ADLT (ELECTROSURGICAL) IMPLANT
GOWN CVR UNV OPN BCK APRN NK (MISCELLANEOUS) ×4 IMPLANT
GOWN ISOL THUMB LOOP REG UNIV (MISCELLANEOUS) ×4
KIT PRC NS LF DISP ENDO (KITS) ×2 IMPLANT
KIT PROCEDURE OLYMPUS (KITS) ×2
MANIFOLD NEPTUNE II (INSTRUMENTS) ×2 IMPLANT
SNARE COLD EXACTO (MISCELLANEOUS) IMPLANT
TRAP ETRAP POLY (MISCELLANEOUS) IMPLANT
WATER STERILE IRR 250ML POUR (IV SOLUTION) ×2 IMPLANT

## 2022-09-22 NOTE — Op Note (Signed)
[facility] Gastroenterology Patient Name: Colleen Rodriguez Procedure Date: 09/22/2022 10:41 AM MRN: [id number] Account #: [id number] Date of Birth: July 23, 1975 Admit Type: Outpatient Age: 47 Room: [facility] OR ROOM 01 Gender: Female Note Status: Finalized Instrument Name: [id number] Procedure:             Colonoscopy Indications:           Screening for colorectal malignant neoplasm Providers:             Midge Minium MD, MD Referring MD:          Lyn Records. Arnett (Referring MD) Medicines:             Propofol per Anesthesia Complications:         No immediate complications. Procedure:             Pre-Anesthesia Assessment:                        - Prior to the procedure, a History and Physical was                         performed, and patient medications and allergies were                         reviewed. The patient's tolerance of previous                         anesthesia was also reviewed. The risks and benefits                         of the procedure and the sedation options and risks                         were discussed with the patient. All questions were                         answered, and informed consent was obtained. Prior                         Anticoagulants: The patient has taken no anticoagulant                         or antiplatelet agents. ASA Grade Assessment: II - A                         patient with mild systemic disease. After reviewing                         the risks and benefits, the patient was deemed in                         satisfactory condition to undergo the procedure.                        After obtaining informed consent, the colonoscope was                         passed under direct vision. Throughout the procedure,  the patient's blood pressure, pulse, and oxygen                         saturations were monitored continuously. The                         Colonoscope was introduced through the anus and                          advanced to the the cecum, identified by appendiceal                         orifice and ileocecal valve. The colonoscopy was                         performed without difficulty. The patient tolerated                         the procedure well. The quality of the bowel                         preparation was excellent. Findings:      The perianal and digital rectal examinations were normal.      Four sessile polyps were found in the transverse colon. The polyps were       6 to 8 mm in size. These polyps were removed with a cold snare.       Resection and retrieval were complete. Impression:            - Four 6 to 8 mm polyps in the transverse colon,                         removed with a cold snare. Resected and retrieved. Recommendation:        - Discharge patient to home.                        - Resume previous diet.                        - Continue present medications.                        - Await pathology results.                        - If the pathology report reveals adenomatous tissue,                         then repeat the colonoscopy for surveillance in 3                         years. Procedure Code(s):     --- Professional ---                        [phone], Colonoscopy, flexible; with removal of                         tumor(s), polyp(s), or other lesion(s) by snare  technique Diagnosis Code(s):     --- Professional ---                        Z12.11, Encounter for screening for malignant neoplasm                         of colon                        D12.3, Benign neoplasm of transverse colon (hepatic                         flexure or splenic flexure) CPT copyright 2022 American Medical Association. All rights reserved. The codes documented in this report are preliminary and upon coder review may  be revised to meet current compliance requirements. Midge Minium MD, MD 09/22/2022 11:09:32 AM This report has been signed  electronically. Number of Addenda: 0 Note Initiated On: 09/22/2022 10:41 AM Scope Withdrawal Time: 0 hours 9 minutes 28 seconds  Total Procedure Duration: 0 hours 17 minutes 58 seconds  Estimated Blood Loss:  Estimated blood loss: none.      [facility]

## 2022-09-22 NOTE — Transfer of Care (Signed)
Immediate Anesthesia Transfer of Care Note  Patient: Colleen Rodriguez  Procedure(s) Performed: COLONOSCOPY WITH PROPOFOL POLYPECTOMY  Patient Location: PACU  Anesthesia Type: General  Level of Consciousness: awake, alert  and patient cooperative  Airway and Oxygen Therapy: Patient Spontanous Breathing and Patient connected to supplemental oxygen  Post-op Assessment: Post-op Vital signs reviewed, Patient's Cardiovascular Status Stable, Respiratory Function Stable, Patent Airway and No signs of Nausea or vomiting  Post-op Vital Signs: Reviewed and stable  Complications: No notable events documented.

## 2022-09-22 NOTE — Anesthesia Postprocedure Evaluation (Signed)
Anesthesia Post Note  Patient: Colleen Rodriguez  Procedure(s) Performed: COLONOSCOPY WITH PROPOFOL POLYPECTOMY  Patient location during evaluation: PACU Anesthesia Type: General Level of consciousness: awake and alert Pain management: pain level controlled Vital Signs Assessment: post-procedure vital signs reviewed and stable Respiratory status: spontaneous breathing, nonlabored ventilation, respiratory function stable and patient connected to nasal cannula oxygen Cardiovascular status: blood pressure returned to baseline and stable Postop Assessment: no apparent nausea or vomiting Anesthetic complications: no   No notable events documented.   Last Vitals:  Vitals:   09/22/22 1115 09/22/22 1120  BP: 98/62 114/73  Pulse: 78 79  Resp: 16 17  Temp:    SpO2: 99% 100%    Last Pain:  Vitals:   09/22/22 1120  TempSrc:   PainSc: 0-No pain                 Havilah Topor C Taina Landry

## 2022-09-22 NOTE — H&P (Signed)
Midge Minium, MD [facility] [address], [address] [place], [place] [address] Phone: [phone] Fax : [phone]  Primary Care Physician:  Allegra Grana, FNP Primary Gastroenterologist:  Dr. Servando Snare  Pre-Procedure History & Physical: HPI:  Colleen Rodriguez is a 47 y.o. female is here for a screening colonoscopy.   Past Medical History:  Diagnosis Date   Anxiety    CIN 3 - cervical intraepithelial neoplasia grade 3    s/p LEEP   HPV (human papilloma virus) infection    Urinary tract infection     Past Surgical History:  Procedure Laterality Date   VAGINAL DELIVERY     2    Prior to Admission medications   Medication Sig Start Date End Date Taking? Authorizing Provider  Cholecalciferol (VITAMIN D3) 250 MCG (10000 UT) capsule Take 10,000 Units by mouth daily.   Yes [provider]  ferrous sulfate ER (SLOW FE) 142 (45 Fe) MG TBCR tablet Take 1 tablet by mouth daily.   Yes [provider]  norethindrone-ethinyl estradiol-FE (LARIN FE 1/20) 1-20 MG-MCG tablet TAKE 1 TABLET BY MOUTH DAILY. 02/15/22  Yes Allegra Grana, FNP  sertraline (ZOLOFT) 100 MG tablet Take 1 tablet (100 mg total) by mouth daily. 03/20/22 03/20/23 Yes Arnett, Lyn Records, FNP  valACYclovir (VALTREX) 1000 MG tablet TAKE 2 TABLETS BY MOUTH EVERY 12 HOURS FOR 1 DAY. START AS SOON AS POSSIBLE AFTER SYMPTOM ONSET 02/15/22 02/15/23 Yes Allegra Grana, FNP    Allergies as of 08/07/2022   (No Known Allergies)    Family History  Problem Relation Age of Onset   Stroke Paternal Grandfather    Breast cancer Neg Hx    Thyroid disease Neg Hx     Social History   Socioeconomic History   Marital status: Single    Spouse name: Not on file   Number of children: Not on file   Years of education: Not on file   Highest education level: Not on file  Occupational History   Not on file  Tobacco Use   Smoking status: Former    Current packs/day: 0.00    Types: Cigarettes    Quit date: 01/30/2006     Years since quitting: 16.6   Smokeless tobacco: Never  Vaping Use   Vaping status: Never Used  Substance and Sexual Activity   Alcohol use: Yes    Comment: occasional   Drug use: No   Sexual activity: Yes    Birth control/protection: None  Other Topics Concern   Not on file  Social History Narrative   Lives in [place] with children 87 YO ( son)  and 22YO ( daughter) .  No pets.      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    Diet - regular   Exercise - 1x per week   Social Determinants of Health   Financial Resource Strain: Not on file  Food Insecurity: Not on file  Transportation Needs: Not on file  Physical Activity: Not on file  Stress: Not on file  Social Connections: Not on file  Intimate Partner Violence: Not on file    Review of Systems: See HPI, otherwise negative ROS  Physical Exam: BP 117/77   Pulse 89   Temp 97.9 F (36.6 C) (Temporal)   Resp 18   Ht 5\' 7"  (1.702 m)   Wt 84.4 kg   SpO2 97%   BMI 29.13 kg/m  General:   Alert,  pleasant and cooperative in NAD Head:  Normocephalic and atraumatic. Neck:  Supple; no masses or thyromegaly. Lungs:  Clear throughout to auscultation.    Heart:  Regular rate and rhythm. Abdomen:  Soft, nontender and nondistended. Normal bowel sounds, without guarding, and without rebound.   Neurologic:  Alert and  oriented x4;  grossly normal neurologically.  Impression/Plan: Colleen Rodriguez is now here to undergo a screening colonoscopy.  Risks, benefits, and alternatives regarding colonoscopy have been reviewed with the patient.  Questions have been answered.  All parties agreeable.

## 2022-09-27 ENCOUNTER — Encounter: Payer: Self-pay | Admitting: Gastroenterology

## 2022-10-11 ENCOUNTER — Encounter: Payer: Self-pay | Admitting: Obstetrics and Gynecology

## 2022-10-18 MED FILL — Valacyclovir HCl Tab 1 GM: ORAL | 7 days supply | Qty: 15 | Fill #2 | Status: AC

## 2022-10-18 MED FILL — Norethindrone Ace & Ethinyl Estradiol-FE Tab 1 MG-20 MCG: ORAL | 84 days supply | Qty: 84 | Fill #3 | Status: AC

## 2023-01-18 ENCOUNTER — Other Ambulatory Visit: Payer: Self-pay

## 2023-01-18 ENCOUNTER — Other Ambulatory Visit: Payer: Self-pay | Admitting: Family

## 2023-01-18 DIAGNOSIS — Z0000 Encounter for general adult medical examination without abnormal findings: Secondary | ICD-10-CM

## 2023-01-18 DIAGNOSIS — B001 Herpesviral vesicular dermatitis: Secondary | ICD-10-CM

## 2023-01-18 DIAGNOSIS — N920 Excessive and frequent menstruation with regular cycle: Secondary | ICD-10-CM

## 2023-01-19 ENCOUNTER — Other Ambulatory Visit: Payer: Self-pay

## 2023-01-19 MED FILL — Valacyclovir HCl Tab 1 GM: ORAL | 90 days supply | Qty: 30 | Fill #0 | Status: AC

## 2023-01-19 MED FILL — Norethindrone Ace & Ethinyl Estradiol-FE Tab 1 MG-20 MCG: ORAL | 84 days supply | Qty: 84 | Fill #0 | Status: AC

## 2023-04-10 MED FILL — Norethindrone Ace & Ethinyl Estradiol-FE Tab 1 MG-20 MCG: ORAL | 84 days supply | Qty: 84 | Fill #1 | Status: AC

## 2023-07-01 ENCOUNTER — Other Ambulatory Visit: Payer: Self-pay | Admitting: Family

## 2023-07-01 DIAGNOSIS — F419 Anxiety disorder, unspecified: Secondary | ICD-10-CM

## 2023-07-01 MED FILL — Norethindrone Ace & Ethinyl Estradiol-FE Tab 1 MG-20 MCG: ORAL | 84 days supply | Qty: 84 | Fill #2 | Status: AC

## 2023-07-01 MED FILL — Valacyclovir HCl Tab 1 GM: ORAL | 90 days supply | Qty: 30 | Fill #1 | Status: AC

## 2023-07-02 ENCOUNTER — Other Ambulatory Visit: Payer: Self-pay

## 2023-07-02 ENCOUNTER — Other Ambulatory Visit: Payer: Self-pay | Admitting: Family

## 2023-07-02 DIAGNOSIS — F419 Anxiety disorder, unspecified: Secondary | ICD-10-CM

## 2023-07-02 NOTE — Telephone Encounter (Signed)
 Called and left VM for patient to call back and schedule appt for yearly that's due and discuss medication request

## 2023-07-03 ENCOUNTER — Other Ambulatory Visit: Payer: Self-pay

## 2023-07-03 MED FILL — Sertraline HCl Tab 100 MG: ORAL | 30 days supply | Qty: 30 | Fill #0 | Status: AC

## 2023-07-31 ENCOUNTER — Ambulatory Visit: Admitting: Family

## 2023-08-07 ENCOUNTER — Other Ambulatory Visit: Payer: Self-pay

## 2023-08-07 ENCOUNTER — Ambulatory Visit: Admitting: Family

## 2023-08-07 ENCOUNTER — Encounter: Payer: Self-pay | Admitting: Family

## 2023-08-07 ENCOUNTER — Other Ambulatory Visit (HOSPITAL_COMMUNITY)
Admission: RE | Admit: 2023-08-07 | Discharge: 2023-08-07 | Disposition: A | Discharge status: Home / Self Care | Source: Ambulatory Visit | Attending: Family | Admitting: Family

## 2023-08-07 VITALS — BP 116/76 | HR 76 | Temp 98.1°F | Ht 67.0 in | Wt 183.8 lb

## 2023-08-07 DIAGNOSIS — Z0000 Encounter for general adult medical examination without abnormal findings: Secondary | ICD-10-CM | POA: Insufficient documentation

## 2023-08-07 DIAGNOSIS — F419 Anxiety disorder, unspecified: Secondary | ICD-10-CM

## 2023-08-07 DIAGNOSIS — E669 Obesity, unspecified: Secondary | ICD-10-CM

## 2023-08-07 DIAGNOSIS — Z6828 Body mass index (BMI) 28.0-28.9, adult: Secondary | ICD-10-CM

## 2023-08-07 LAB — LIPID PANEL
Cholesterol: 227 mg/dL — ABNORMAL HIGH (ref 0–200)
HDL: 82.3 mg/dL (ref >39.00)
LDL Cholesterol: 129 mg/dL — ABNORMAL HIGH (ref 0–99)
NonHDL: 144.71
Total CHOL/HDL Ratio: 3
Triglycerides: 80 mg/dL (ref 0.0–149.0)
VLDL: 16 mg/dL (ref 0.0–40.0)

## 2023-08-07 LAB — COMPREHENSIVE METABOLIC PANEL WITH GFR
ALT: 19 U/L (ref 0–35)
AST: 13 U/L (ref 0–37)
Albumin: 4.3 g/dL (ref 3.5–5.2)
Alkaline Phosphatase: 54 U/L (ref 39–117)
BUN: 11 mg/dL (ref 6–23)
CO2: 28 meq/L (ref 19–32)
Calcium: 9.2 mg/dL (ref 8.4–10.5)
Chloride: 103 meq/L (ref 96–112)
Creatinine, Ser: 0.64 mg/dL (ref 0.40–1.20)
GFR: 105.27 mL/min (ref >60.00)
Glucose, Bld: 93 mg/dL (ref 70–99)
Potassium: 4.3 meq/L (ref 3.5–5.1)
Sodium: 138 meq/L (ref 135–145)
Total Bilirubin: 0.8 mg/dL (ref 0.2–1.2)
Total Protein: 6.9 g/dL (ref 6.0–8.3)

## 2023-08-07 LAB — TSH: TSH: 2.35 u[IU]/mL (ref 0.35–5.50)

## 2023-08-07 LAB — CBC WITH DIFFERENTIAL/PLATELET
Basophils Absolute: 0 K/uL (ref 0.0–0.1)
Basophils Relative: 0.5 % (ref 0.0–3.0)
Eosinophils Absolute: 0.3 K/uL (ref 0.0–0.7)
Eosinophils Relative: 3.5 % (ref 0.0–5.0)
HCT: 41 % (ref 36.0–46.0)
Hemoglobin: 13.9 g/dL (ref 12.0–15.0)
Lymphocytes Relative: 24.9 % (ref 12.0–46.0)
Lymphs Abs: 1.9 K/uL (ref 0.7–4.0)
MCHC: 34 g/dL (ref 30.0–36.0)
MCV: 96.3 fl (ref 78.0–100.0)
Monocytes Absolute: 0.4 K/uL (ref 0.1–1.0)
Monocytes Relative: 5.8 % (ref 3.0–12.0)
Neutro Abs: 5 K/uL (ref 1.4–7.7)
Neutrophils Relative %: 65.3 % (ref 43.0–77.0)
Platelets: 300 K/uL (ref 150.0–400.0)
RBC: 4.26 Mil/uL (ref 3.87–5.11)
RDW: 13.3 % (ref 11.5–15.5)
WBC: 7.7 K/uL (ref 4.0–10.5)

## 2023-08-07 LAB — HEMOGLOBIN A1C: Hgb A1c MFr Bld: 5.4 % (ref 4.6–6.5)

## 2023-08-07 LAB — VITAMIN D 25 HYDROXY (VIT D DEFICIENCY, FRACTURES): VITD: 25.37 ng/mL — ABNORMAL LOW (ref 30.00–100.00)

## 2023-08-07 MED ORDER — SERTRALINE HCL 100 MG PO TABS
100.0000 mg | ORAL_TABLET | Freq: Every day | ORAL | 3 refills | Status: AC
  Filled 2023-08-07: qty 90, 90d supply, fill #0
  Filled 2023-11-07: qty 90, 90d supply, fill #1
  Filled 2024-02-11: qty 90, 90d supply, fill #2

## 2023-08-07 MED ORDER — BUPROPION HCL ER (XL) 150 MG PO TB24
150.0000 mg | ORAL_TABLET | Freq: Every day | ORAL | 3 refills | Status: AC
  Filled 2023-08-07: qty 90, 90d supply, fill #0
  Filled 2023-11-07: qty 90, 90d supply, fill #1
  Filled 2024-02-11: qty 90, 90d supply, fill #2

## 2023-08-07 NOTE — Patient Instructions (Signed)
 Trial of wellbutrin   Monitor for increased anxiety, irritability  Referral to GYN for cervical polyp  Let us  know if you dont hear back within a week in regards to an appointment being scheduled.   So that you are aware, if you are [facility] MyChart user , please pay attention to your MyChart messages as you may receive a MyChart message with a phone number to call and schedule this test/appointment own your own from our referral coordinator. This is a new process so I do not want you to miss this message.  If you are not a MyChart user, you will receive a phone call.    Health Maintenance, Female Adopting a healthy lifestyle and getting preventive care are important in promoting health and wellness. Ask your health care provider about: The right schedule for you to have regular tests and exams. Things you can do on your own to prevent diseases and keep yourself healthy. What should I know about diet, weight, and exercise? Eat a healthy diet  Eat a diet that includes plenty of vegetables, fruits, low-fat dairy products, and lean protein. Do not eat a lot of foods that are high in solid fats, added sugars, or sodium. Maintain a healthy weight Body mass index (BMI) is used to identify weight problems. It estimates body fat based on height and weight. Your health care provider can help determine your BMI and help you achieve or maintain a healthy weight. Get regular exercise Get regular exercise. This is one of the most important things you can do for your health. Most adults should: Exercise for at least 150 minutes each week. The exercise should increase your heart rate and make you sweat (moderate-intensity exercise). Do strengthening exercises at least twice a week. This is in addition to the moderate-intensity exercise. Spend less time sitting. Even light physical activity can be beneficial. Watch cholesterol and blood lipids Have your blood tested for lipids and cholesterol at 47 years of  age, then have this test every 5 years. Have your cholesterol levels checked more often if: Your lipid or cholesterol levels are high. You are older than 47 years of age. You are at high risk for heart disease. What should I know about cancer screening? Depending on your health history and family history, you may need to have cancer screening at various ages. This may include screening for: Breast cancer. Cervical cancer. Colorectal cancer. Skin cancer. Lung cancer. What should I know about heart disease, diabetes, and high blood pressure? Blood pressure and heart disease High blood pressure causes heart disease and increases the risk of stroke. This is more likely to develop in people who have high blood pressure readings or are overweight. Have your blood pressure checked: Every 3-5 years if you are 75-68 years of age. Every year if you are 35 years old or older. Diabetes Have regular diabetes screenings. This checks your fasting blood sugar level. Have the screening done: Once every three years after age 26 if you are at a normal weight and have a low risk for diabetes. More often and at a younger age if you are overweight or have a high risk for diabetes. What should I know about preventing infection? Hepatitis B If you have a higher risk for hepatitis B, you should be screened for this virus. Talk with your health care provider to find out if you are at risk for hepatitis B infection. Hepatitis C Testing is recommended for: Everyone born from [birth year] through 1964. Anyone with known  risk factors for hepatitis C. Sexually transmitted infections (STIs) Get screened for STIs, including gonorrhea and chlamydia, if: You are sexually active and are younger than 47 years of age. You are older than 47 years of age and your health care provider tells you that you are at risk for this type of infection. Your sexual activity has changed since you were last screened, and you are at increased  risk for chlamydia or gonorrhea. Ask your health care provider if you are at risk. Ask your health care provider about whether you are at high risk for HIV. Your health care provider may recommend a prescription medicine to help prevent HIV infection. If you choose to take medicine to prevent HIV, you should first get tested for HIV. You should then be tested every 3 months for as long as you are taking the medicine. Pregnancy If you are about to stop having your period (premenopausal) and you may become pregnant, seek counseling before you get pregnant. Take 400 to 800 micrograms (mcg) of folic acid  every day if you become pregnant. Ask for birth control (contraception) if you want to prevent pregnancy. Osteoporosis and menopause Osteoporosis is a disease in which the bones lose minerals and strength with aging. This can result in bone fractures. If you are 57 years old or older, or if you are at risk for osteoporosis and fractures, ask your health care provider if you should: Be screened for bone loss. Take a calcium or vitamin D  supplement to lower your risk of fractures. Be given hormone replacement therapy (HRT) to treat symptoms of menopause. Follow these instructions at home: Alcohol use Do not drink alcohol if: Your health care provider tells you not to drink. You are pregnant, may be pregnant, or are planning to become pregnant. If you drink alcohol: Limit how much you have to: 0-1 drink a day. Know how much alcohol is in your drink. In the [place] one drink equals one 12 oz bottle of beer (355 mL), one 5 oz glass of wine (148 mL), or one 1 oz glass of hard liquor (44 mL). Lifestyle Do not use any products that contain nicotine or tobacco. These products include cigarettes, chewing tobacco, and vaping devices, such as e-cigarettes. If you need help quitting, ask your health care provider. Do not use street drugs. Do not share needles. Ask your health care provider for help if you need  support or information about quitting drugs. General instructions Schedule regular health, dental, and eye exams. Stay current with your vaccines. Tell your health care provider if: You often feel depressed. You have ever been abused or do not feel safe at home. Summary Adopting a healthy lifestyle and getting preventive care are important in promoting health and wellness. Follow your health care provider's instructions about healthy diet, exercising, and getting tested or screened for diseases. Follow your health care provider's instructions on monitoring your cholesterol and blood pressure. This information is not intended to replace advice given to you by your health care provider. Make sure you discuss any questions you have with your health care provider. Document Revised: 06/07/2020 Document Reviewed: 06/07/2020 Elsevier Patient Education  2024 [employer].

## 2023-08-07 NOTE — Progress Notes (Unsigned)
 Assessment & Plan:  Anxiety     Return precautions given.   Risks, benefits, and alternatives of the medications and treatment plan prescribed today were discussed, and patient expressed understanding.   Education regarding symptom management and diagnosis given to patient on AVS either electronically or printed.  No follow-ups on file.  Rollene Northern, FNP  Subjective:    Patient ID: Colleen Rodriguez, female    DOB: 1975/07/08, 48 y.o.   MRN: [id number]  CC: Colleen Rodriguez is a 48 y.o. female who presents today for physical exam.    HPI: She has struggled to loose weight  No h/o seizure  No anorexia or bulminia.   Compliant with zoloft  and feels effective.       Colorectal Cancer Screening: UTD , repeat in 3 years, due 2027.  Breast Cancer Screening: Mammogram due Cervical Cancer Screening: abnormal 08/01/22 ; referred to GYN  negative HPV however atypical squamous ( ASC-US ) cells are seen.   H/o CIN 3 and s/p LEEP; consult regarding treatment versus surveillance    Lung Cancer Screening: Doesn't have 20 year pack year history and age > 4 years yo 8 years        Tetanus - UTD Exercise: Gets regular exercise walking   Alcohol use:  occassional Smoking/tobacco use: former smoker.    Health Maintenance  Topic Date Due   Hepatitis B Vaccine (1 of 3 - 19+ 3-dose series) Never done   COVID-19 Vaccine (4 - 2024-25 season) 10/01/2022   Flu Shot  08/31/2023   Pap Smear  07/31/2025   Colon Cancer Screening  09/21/2025   DTaP/Tdap/Td vaccine (3 - Td or Tdap) 03/26/2030   Hepatitis C Screening  Completed   HIV Screening  Addressed   HPV Vaccine  Aged Out   Meningitis B Vaccine  Aged Out    ALLERGIES: Patient has no known allergies.  Current Outpatient Medications on File Prior to Visit  Medication Sig Dispense Refill   ferrous sulfate ER (SLOW FE) 142 (45 Fe) MG TBCR tablet Take 1 tablet by mouth daily.     norethindrone -ethinyl estradiol -FE (LARIN  FE 1/20)  1-20 MG-MCG tablet Take 1 tablet by mouth daily. 28 tablet 11   sertraline  (ZOLOFT ) 100 MG tablet Take 1 tablet (100 mg total) by mouth daily. 30 tablet 0   valACYclovir  (VALTREX ) 1000 MG tablet Take 0.5 tablets (500 mg total) by mouth 2 (two) times daily for 3 days on first sx onset. 30 tablet 2   Cholecalciferol  (VITAMIN D3) 250 MCG (10000 UT) capsule Take 10,000 Units by mouth daily. (Patient not taking: Reported on 08/07/2023)     No current facility-administered medications on file prior to visit.    Review of Systems    Objective:    BP 116/76   Pulse 76   Temp 98.1 F (36.7 C) (Oral)   Ht 5' 7 (1.702 m)   Wt 183 lb 12.8 oz (83.4 kg)   SpO2 97%   BMI 28.79 kg/m   BP Readings from Last 3 Encounters:  08/07/23 116/76  09/22/22 114/73  08/01/22 118/78   Wt Readings from Last 3 Encounters:  08/07/23 183 lb 12.8 oz (83.4 kg)  09/22/22 186 lb (84.4 kg)  08/01/22 198 lb 6.4 oz (90 kg)      08/07/2023   10:41 AM 08/01/2022   11:11 AM 03/20/2022    3:59 PM  Depression screen PHQ 2/9  Decreased Interest 0 0 0  Down, Depressed, Hopeless 0 0 0  PHQ - 2 Score 0 0 0  Altered sleeping  0 2  Tired, decreased energy  0 2  Change in appetite  0 0  Feeling bad or failure about yourself   0 0  Trouble concentrating  0 0  Moving slowly or fidgety/restless  0 0  Suicidal thoughts  0 0  PHQ-9 Score  0 4  Difficult doing work/chores  Not difficult at all Not difficult at all     Physical Exam

## 2023-08-08 NOTE — Assessment & Plan Note (Signed)
 Chronic, stable.  Continue Zoloft 100 mg qd

## 2023-08-08 NOTE — Assessment & Plan Note (Signed)
 Discussed difficulty in losing weight as potential side effect of Zoloft .  She has done quite well on Zoloft  ; we jointly agreed not to change dose at this time.  Patient and I agreed trial of Wellbutrin  as an adjunct to see if effective for appetite suppression.  Counseled on monitoring for irritability , anxiety. Counseled on wellbutrin  interaction with alcohol.

## 2023-08-08 NOTE — Assessment & Plan Note (Signed)
 Clinical breast exam performed.  Pap smear obtained.  Cervical polyp on exam referral to GYN has been made.  Encouraged continued walking.

## 2023-08-13 LAB — CYTOLOGY - PAP
Comment: NEGATIVE
Diagnosis: NEGATIVE
High risk HPV: NEGATIVE

## 2023-08-20 ENCOUNTER — Encounter: Payer: Self-pay | Admitting: Family

## 2023-08-20 ENCOUNTER — Ambulatory Visit: Payer: Self-pay | Admitting: Family

## 2023-09-17 ENCOUNTER — Encounter: Payer: Self-pay | Admitting: Obstetrics

## 2023-09-17 MED FILL — Norethindrone Ace & Ethinyl Estradiol-FE Tab 1 MG-20 MCG: ORAL | 84 days supply | Qty: 84 | Fill #3 | Status: AC

## 2023-09-17 NOTE — Progress Notes (Signed)
 GYNECOLOGY PROGRESS NOTE: NEW PATIENT  Subjective:  PCP: Colleen Colleen MATSU, FNP  Patient ID: Colleen Rodriguez, female    DOB: Dec 20, 1975, 48 y.o.   MRN: [id number]  HPI  Patient is a 48 y.o. [id number] female who presents as referral from Colleen Dineen, FNP for evaluation of cervical polyp noted during pap smear on 08/07/23 at [facility], results NILM, HPV-.  Hx of LEEP in 2024 - CIN 3, this most recent pap was NILM, HPV-. LMP 03/2022. Had some brown spotting today, started this morning.   LMP: 03/2022 Last pap: 08/07/23, NLM, HPV- Hx of abnormal: yes, s/p LEEP in 2024 STIHx: denies  OB History  Gravida Para Term Preterm AB Living  2 2 1 1  0 0  SAB IAB Ectopic Multiple Live Births  0 0 0 0 0    # Outcome Date GA Lbr Len/2nd Weight Sex Type Anes PTL Lv  2 Preterm 09/24/98    F Vag-Spont     1 Term 07/08/97    Colleen Rodriguez Vag-Spont      Past Medical History:  Diagnosis Date   Anxiety    CIN 3 - cervical intraepithelial neoplasia grade 3    s/p LEEP   HPV (human papilloma virus) infection    Urinary tract infection    Patient Active Problem List   Diagnosis Date Noted   Polyp of transverse colon 09/22/2022   Special screening for malignant neoplasms, colon 09/22/2022   Obesity (BMI 35.0-39.9 without comorbidity) 03/20/2022   Menorrhagia 03/26/2020   Herpes labialis 01/15/2019   Anxiety 09/13/2018   Annual physical exam 12/06/2012   Contraception 06/12/2011   Acquired hypothyroidism 06/12/2011   Anemia 06/12/2011   Seasonal allergies 06/12/2011   Past Surgical History:  Procedure Laterality Date   COLONOSCOPY WITH PROPOFOL  N/A 09/22/2022   Procedure: COLONOSCOPY WITH PROPOFOL ;  Surgeon: Jinny Carmine, MD;  Location: [facility];  Service: Endoscopy;  Laterality: N/A;   POLYPECTOMY  09/22/2022   Procedure: POLYPECTOMY;  Surgeon: Jinny Carmine, MD;  Location: [facility];  Service: Endoscopy;;   VAGINAL DELIVERY     2   Family History  Problem Relation Age  of Onset   Stroke Paternal Grandfather    Breast cancer Neg Hx    Thyroid  disease Neg Hx    Social History   Socioeconomic History   Marital status: Single    Spouse name: Not on file   Number of children: Not on file   Years of education: Not on file   Highest education level: Not on file  Occupational History   Not on file  Tobacco Use   Smoking status: Former    Current packs/day: 0.00    Types: Cigarettes    Quit date: 01/30/2006    Years since quitting: 17.6   Smokeless tobacco: Never  Vaping Use   Vaping status: Never Used  Substance and Sexual Activity   Alcohol use: Yes    Comment: occasional   Drug use: No   Sexual activity: Yes    Birth control/protection: None  Other Topics Concern   Not on file  Social History Narrative   Lives in [place] with children 37 YO ( son)  and 22YO ( daughter) .  No pets.      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    Diet - regular   Exercise - 1x per week   Social Drivers of Health   Financial  Resource Strain: Not on file  Food Insecurity: Not on file  Transportation Needs: Not on file  Physical Activity: Not on file  Stress: Not on file  Social Connections: Not on file  Intimate Partner Violence: Not on file   Current Outpatient Medications on File Prior to Visit  Medication Sig Dispense Refill   buPROPion  (WELLBUTRIN  XL) 150 MG 24 hr tablet Take 1 tablet (150 mg total) by mouth daily. 90 tablet 3   Cholecalciferol  (VITAMIN D3) 250 MCG (10000 UT) capsule Take 10,000 Units by mouth daily.     ferrous sulfate ER (SLOW FE) 142 (45 Fe) MG TBCR tablet Take 1 tablet by mouth daily.     norethindrone -ethinyl estradiol -FE (LARIN  FE 1/20) 1-20 MG-MCG tablet Take 1 tablet by mouth daily. 28 tablet 11   sertraline  (ZOLOFT ) 100 MG tablet Take 1 tablet (100 mg total) by mouth daily. 90 tablet 3   valACYclovir  (VALTREX ) 1000 MG tablet Take 0.5 tablets (500 mg total) by mouth 2 (two) times daily  for 3 days on first sx onset. 30 tablet 2   No current facility-administered medications on file prior to visit.   No Known Allergies  Review of Systems Pertinent items are noted in HPI.   Objective:   Blood pressure 128/76, pulse 74, height 5' 7 (1.702 m), weight 190 lb 4.8 oz (86.3 kg), last menstrual period 04/02/2022. Body mass index is 29.81 kg/m.  General appearance: alert, cooperative, and no distress Abdomen: soft, non-tender; bowel sounds normal; no masses,  no organomegaly Pelvic: cervix normal in appearance, external genitalia normal, no adnexal masses or tenderness, no cervical motion tenderness, positive findings: cervical polyp, rectovaginal septum normal, uterus normal size, shape, and consistency, and vagina normal without discharge Extremities: extremities normal, atraumatic, no cyanosis or edema Neurologic: Grossly normal  POLYPECTOMY: Cervix visualized and polyp noted.  Ring forcep applied to cervix and twisting motion removed polyp intact.  Hemostasis obtained.  Assessment/Plan:   1. Cervical polyp   -Polyp removed and sent to pathology; will message with results -Suspect polyp responsible for spotting today, not PMB; discussed polyp can re-grow and main symptom is irregular bleeding.  -Recommend yearly pelvic exam with us  or PCP to visualize cervix for polyp recurrence -Follow up prn   Estil Mangle, DO [facility]

## 2023-09-25 ENCOUNTER — Ambulatory Visit: Admitting: Family

## 2023-09-25 ENCOUNTER — Other Ambulatory Visit (HOSPITAL_COMMUNITY)
Admission: RE | Admit: 2023-09-25 | Discharge: 2023-09-25 | Disposition: A | Discharge status: Home / Self Care | Source: Ambulatory Visit | Attending: Obstetrics | Admitting: Obstetrics

## 2023-09-25 ENCOUNTER — Ambulatory Visit (INDEPENDENT_AMBULATORY_CARE_PROVIDER_SITE_OTHER): Admitting: Obstetrics

## 2023-09-25 ENCOUNTER — Encounter: Payer: Self-pay | Admitting: Obstetrics

## 2023-09-25 VITALS — BP 128/76 | HR 74 | Ht 67.0 in | Wt 190.3 lb

## 2023-09-25 DIAGNOSIS — N841 Polyp of cervix uteri: Secondary | ICD-10-CM | POA: Diagnosis not present

## 2023-09-27 LAB — SURGICAL PATHOLOGY

## 2023-10-02 ENCOUNTER — Ambulatory Visit: Payer: Self-pay | Admitting: Obstetrics

## 2023-11-07 MED FILL — Valacyclovir HCl Tab 1 GM: ORAL | 90 days supply | Qty: 30 | Fill #2 | Status: AC

## 2023-11-28 ENCOUNTER — Other Ambulatory Visit: Payer: Self-pay

## 2023-11-28 ENCOUNTER — Other Ambulatory Visit: Payer: Self-pay | Admitting: Family

## 2023-11-28 DIAGNOSIS — N920 Excessive and frequent menstruation with regular cycle: Secondary | ICD-10-CM

## 2023-11-28 DIAGNOSIS — Z0000 Encounter for general adult medical examination without abnormal findings: Secondary | ICD-10-CM

## 2023-11-28 MED ORDER — NORETHIN ACE-ETH ESTRAD-FE 1-20 MG-MCG PO TABS
1.0000 | ORAL_TABLET | Freq: Every day | ORAL | 11 refills | Status: AC
  Filled 2023-11-28 – 2023-12-04 (×2): qty 84, 84d supply, fill #0
  Filled 2024-02-11 – 2024-03-02 (×2): qty 84, 84d supply, fill #1

## 2023-12-04 ENCOUNTER — Other Ambulatory Visit: Payer: Self-pay

## 2024-01-26 ENCOUNTER — Telehealth: Admitting: Nurse Practitioner

## 2024-01-26 DIAGNOSIS — J029 Acute pharyngitis, unspecified: Secondary | ICD-10-CM | POA: Diagnosis not present

## 2024-01-26 MED ORDER — LIDOCAINE VISCOUS HCL 2 % MT SOLN: 15.0000 mL | OROMUCOSAL | 1 refills | Status: AC | PRN

## 2024-01-26 NOTE — Progress Notes (Signed)
 We are sorry that you are not feeling well.  Here is how we plan to help!  Your symptoms indicate a likely viral infection (Pharyngitis).   Pharyngitis is inflammation in the back of the throat which can cause a sore throat, scratchiness and sometimes difficulty swallowing.   Pharyngitis is typically caused by a respiratory virus and will just run its course.  Please keep in mind that your symptoms could last up to 10 days.    For throat pain, we recommend over the counter oral pain relief medications such as acetaminophen or aspirin, or anti-inflammatory medications such as ibuprofen or naproxen  sodium.  Topical treatments such as oral throat lozenges or sprays may be used as needed. For throat pain, I have prescribed a Viscous Lidocaine  2% solution. Swallow 5-10 mL every 4-6 hours as needed for sore throat. DO NOT eat or drink anything for 15-20 minutes after swallowing to allow the medication to coat the throat.SABRA  Avoid close contact with loved ones, especially the very young and elderly.  Remember to wash your hands thoroughly throughout the day as this is the number one way to prevent the spread of infection! We also recommend that you periodically wipe down door knobs and counters with disinfectant.  After careful review of your answers, I would not recommend and antibiotic for your condition.  Antibiotics should not be used to treat conditions that we suspect are caused by viruses like the virus that causes the common cold or flu.  Providers prescribe antibiotics to treat infections caused by bacteria. Antibiotics are very powerful in treating bacterial infections when they are used properly.  To maintain their effectiveness, they should be used only when necessary.  Overuse of antibiotics has resulted in the development of super bugs that are resistant to treatment!    Some people with strep throat, however, can have atypical symptoms. As such, if anything continued to progress despite treatment  recommendations, you may need formal testing in clinic or office.  Home Care: Only take medications as instructed by your medical team. Do not drink alcohol while taking these medications. A steam or ultrasonic humidifier can help congestion.  You can place a towel over your head and breathe in the steam from hot water coming from a faucet. Avoid close contacts especially the very young and the elderly. Cover your mouth when you cough or sneeze. Always remember to wash your hands.  Get Help Right Away If: You develop worsening fever or throat pain. You develop a severe head ache or visual changes. Your symptoms persist after you have completed your treatment plan.  Make sure you Understand these instructions. Will watch your condition. Will get help right away if you are not doing well or get worse.  Your e-visit answers were reviewed by a board certified advanced clinical practitioner to complete your personal care plan.  Depending on the condition, your plan could have included both over the counter or prescription medications.  If there is a problem please reply once you have received a response from your provider.  Your safety is important to us .  If you have drug allergies check your prescription carefully.    You can use MyChart to ask questions about todays visit, request a non-urgent call back, or ask for a work or school excuse for 24 hours related to this e-Visit. If it has been greater than 24 hours you will need to follow up with your provider, or enter a new e-Visit to address those concerns.  You will get an e-mail in the next two days asking about your experience.  I hope that your e-visit has been valuable and will speed your recovery. Thank you for using e-visits.   I have spent 5 minutes in review of e-visit questionnaire, review and updating patient chart, medical decision making and response to patient.   Quintasha Gren W Jimmy Stipes, NP

## 2024-02-11 ENCOUNTER — Other Ambulatory Visit: Payer: Self-pay

## 2024-02-11 ENCOUNTER — Other Ambulatory Visit: Payer: Self-pay | Admitting: Family

## 2024-02-11 DIAGNOSIS — B001 Herpesviral vesicular dermatitis: Secondary | ICD-10-CM

## 2024-02-12 ENCOUNTER — Other Ambulatory Visit: Payer: Self-pay

## 2024-02-13 ENCOUNTER — Other Ambulatory Visit: Payer: Self-pay

## 2024-02-13 MED FILL — Valacyclovir HCl Tab 1 GM: ORAL | 30 days supply | Qty: 30 | Fill #0 | Status: AC

## 2024-02-16 ENCOUNTER — Other Ambulatory Visit: Payer: Self-pay

## 2024-03-04 ENCOUNTER — Other Ambulatory Visit: Payer: Self-pay

## 2024-03-04 ENCOUNTER — Telehealth: Admitting: Physician Assistant

## 2024-03-04 DIAGNOSIS — J329 Chronic sinusitis, unspecified: Secondary | ICD-10-CM

## 2024-03-04 MED ORDER — AZELASTINE HCL 0.1 % NA SOLN
2.0000 | Freq: Two times a day (BID) | NASAL | 0 refills | Status: AC
  Filled 2024-03-04: qty 30, 50d supply, fill #0

## 2024-03-04 MED ORDER — AMOXICILLIN-POT CLAVULANATE 875-125 MG PO TABS
1.0000 | ORAL_TABLET | Freq: Two times a day (BID) | ORAL | 0 refills | Status: AC
  Filled 2024-03-04: qty 14, 7d supply, fill #0

## 2024-03-04 NOTE — Progress Notes (Signed)
"      E-Visit for Sinus Problems  We are sorry that you are not feeling well.  Here is how we plan to help!  Based on what you have shared with me it looks like you have sinusitis.  Sinusitis is inflammation and infection in the sinus cavities of the head.  Based on your presentation I believe you most likely have Acute Bacterial Sinusitis.  This is an infection caused by bacteria and is treated with antibiotics. I have prescribed Augmentin  875mg /125mg  one tablet twice daily with food, for 7 days. and I have also prescribed Azelastine  Nasal Spray Use 1 spray in each nostril twice daily for 10-14 days You may use an oral decongestant such as Mucinex D or if you have glaucoma or high blood pressure use plain Mucinex. Saline nasal spray help and can safely be used as often as needed for congestion.  If you develop worsening sinus pain, fever or notice severe headache and vision changes, or if symptoms are not better after completion of antibiotic, please schedule an appointment with a health care provider.    Sinus infections are not as easily transmitted as other respiratory infection, however we still recommend that you avoid close contact with loved ones, especially the very young and elderly.  Remember to wash your hands thoroughly throughout the day as this is the number one way to prevent the spread of infection!  Home Care: Only take medications as instructed by your medical team. Complete the entire course of an antibiotic. Do not take these medications with alcohol. A steam or ultrasonic humidifier can help congestion.  You can place a towel over your head and breathe in the steam from hot water  coming from a faucet. Avoid close contacts especially the very young and the elderly. Cover your mouth when you cough or sneeze. Always remember to wash your hands.  Get Help Right Away If: You develop worsening fever or sinus pain. You develop a severe head ache or visual changes. Your symptoms  persist after you have completed your treatment plan.  Make sure you Understand these instructions. Will watch your condition. Will get help right away if you are not doing well or get worse.  Your e-visit answers were reviewed by a board certified advanced clinical practitioner to complete your personal care plan.  Depending on the condition, your plan could have included both over the counter or prescription medications.  If there is a problem please reply  once you have received a response from your provider.  Your safety is important to us .  If you have drug allergies check your prescription carefully.    You can use MyChart to ask questions about todays visit, request a non-urgent call back, or ask for a work or school excuse for 24 hours related to this e-Visit. If it has been greater than 24 hours you will need to follow up with your provider, or enter a new e-Visit to address those concerns.  You will get an e-mail in the next two days asking about your experience.  I hope that your e-visit has been valuable and will speed your recovery. Thank you for using e-visits.  I have spent 5 minutes in review of e-visit questionnaire, review and updating patient chart, medical decision making and response to patient.   Etta JINNY Sierras, NP     "
